# Patient Record
Sex: Male | Born: 1955 | Race: White | Hispanic: No | Marital: Married | Smoking: Current every day smoker
Health system: Southern US, Community
[De-identification: ages and names within clinical notes are randomized; demographics above are authoritative.]

## PROBLEM LIST (undated history)

## (undated) DIAGNOSIS — N200 Calculus of kidney: Secondary | ICD-10-CM

## (undated) DIAGNOSIS — M51369 Other intervertebral disc degeneration, lumbar region without mention of lumbar back pain or lower extremity pain: Secondary | ICD-10-CM

## (undated) DIAGNOSIS — E119 Type 2 diabetes mellitus without complications: Secondary | ICD-10-CM

## (undated) DIAGNOSIS — I2510 Atherosclerotic heart disease of native coronary artery without angina pectoris: Secondary | ICD-10-CM

## (undated) DIAGNOSIS — I739 Peripheral vascular disease, unspecified: Secondary | ICD-10-CM

## (undated) DIAGNOSIS — E785 Hyperlipidemia, unspecified: Secondary | ICD-10-CM

## (undated) DIAGNOSIS — C801 Malignant (primary) neoplasm, unspecified: Secondary | ICD-10-CM

## (undated) DIAGNOSIS — M5136 Other intervertebral disc degeneration, lumbar region: Secondary | ICD-10-CM

## (undated) DIAGNOSIS — M545 Low back pain, unspecified: Secondary | ICD-10-CM

## (undated) DIAGNOSIS — J449 Chronic obstructive pulmonary disease, unspecified: Secondary | ICD-10-CM

## (undated) DIAGNOSIS — K219 Gastro-esophageal reflux disease without esophagitis: Secondary | ICD-10-CM

## (undated) HISTORY — PX: BILATERAL SALPINGOOPHORECTOMY: SHX1223

## (undated) HISTORY — DX: Other intervertebral disc degeneration, lumbar region: M51.36

## (undated) HISTORY — DX: Gastro-esophageal reflux disease without esophagitis: K21.9

## (undated) HISTORY — DX: Peripheral vascular disease, unspecified: I73.9

## (undated) HISTORY — DX: Other intervertebral disc degeneration, lumbar region without mention of lumbar back pain or lower extremity pain: M51.369

## (undated) HISTORY — DX: Calculus of kidney: N20.0

## (undated) HISTORY — PX: OTHER SURGICAL HISTORY: SHX169

## (undated) HISTORY — DX: Low back pain, unspecified: M54.50

## (undated) HISTORY — DX: Chronic obstructive pulmonary disease, unspecified: J44.9

## (undated) HISTORY — DX: Hyperlipidemia, unspecified: E78.5

## (undated) HISTORY — DX: Type 2 diabetes mellitus without complications: E11.9

## (undated) HISTORY — DX: Low back pain: M54.5

## (undated) HISTORY — DX: Atherosclerotic heart disease of native coronary artery without angina pectoris: I25.10

---

## 1998-07-02 ENCOUNTER — Emergency Department (HOSPITAL_COMMUNITY): Admission: EM | Admit: 1998-07-02 | Discharge: 1998-07-02 | Payer: Self-pay | Admitting: Emergency Medicine

## 1999-07-30 ENCOUNTER — Ambulatory Visit (HOSPITAL_COMMUNITY): Admission: RE | Admit: 1999-07-30 | Discharge: 1999-07-30 | Payer: Self-pay | Admitting: *Deleted

## 1999-07-30 ENCOUNTER — Encounter: Payer: Self-pay | Admitting: *Deleted

## 2000-07-08 ENCOUNTER — Emergency Department (HOSPITAL_COMMUNITY): Admission: EM | Admit: 2000-07-08 | Discharge: 2000-07-08 | Payer: Self-pay | Admitting: Emergency Medicine

## 2007-01-20 ENCOUNTER — Observation Stay (HOSPITAL_COMMUNITY): Admission: EM | Admit: 2007-01-20 | Discharge: 2007-01-21 | Payer: Self-pay | Admitting: Emergency Medicine

## 2007-01-25 ENCOUNTER — Ambulatory Visit: Payer: Self-pay | Admitting: Cardiology

## 2007-01-27 ENCOUNTER — Encounter (HOSPITAL_COMMUNITY): Admission: RE | Admit: 2007-01-27 | Discharge: 2007-01-27 | Payer: Self-pay | Admitting: Cardiology

## 2007-02-21 ENCOUNTER — Emergency Department (HOSPITAL_COMMUNITY): Admission: EM | Admit: 2007-02-21 | Discharge: 2007-02-21 | Payer: Self-pay | Admitting: *Deleted

## 2009-01-29 ENCOUNTER — Ambulatory Visit: Payer: Self-pay | Admitting: Family Medicine

## 2009-01-29 DIAGNOSIS — J4489 Other specified chronic obstructive pulmonary disease: Secondary | ICD-10-CM | POA: Insufficient documentation

## 2009-01-29 DIAGNOSIS — J449 Chronic obstructive pulmonary disease, unspecified: Secondary | ICD-10-CM

## 2009-01-29 DIAGNOSIS — I10 Essential (primary) hypertension: Secondary | ICD-10-CM | POA: Insufficient documentation

## 2009-01-29 DIAGNOSIS — E119 Type 2 diabetes mellitus without complications: Secondary | ICD-10-CM

## 2009-01-29 DIAGNOSIS — M5137 Other intervertebral disc degeneration, lumbosacral region: Secondary | ICD-10-CM

## 2009-01-29 DIAGNOSIS — F17200 Nicotine dependence, unspecified, uncomplicated: Secondary | ICD-10-CM | POA: Insufficient documentation

## 2009-01-29 DIAGNOSIS — E785 Hyperlipidemia, unspecified: Secondary | ICD-10-CM

## 2009-01-29 LAB — CONVERTED CEMR LAB

## 2009-01-30 ENCOUNTER — Encounter: Payer: Self-pay | Admitting: Family Medicine

## 2009-01-31 ENCOUNTER — Telehealth: Payer: Self-pay | Admitting: Family Medicine

## 2009-01-31 LAB — CONVERTED CEMR LAB
ALT: 34 units/L (ref 0–53)
AST: 30 units/L (ref 0–37)
BUN: 14 mg/dL (ref 6–23)
CO2: 26 meq/L (ref 19–32)
Calcium: 9.5 mg/dL (ref 8.4–10.5)
Chloride: 100 meq/L (ref 96–112)
Cholesterol: 253 mg/dL — ABNORMAL HIGH (ref 0–200)
Creatinine, Ser: 0.95 mg/dL (ref 0.40–1.50)
HDL: 40 mg/dL (ref >39)
PSA, Free Pct: 15 — ABNORMAL LOW (ref >25)
PSA, Free: 0.2 ng/mL
Total Bilirubin: 0.5 mg/dL (ref 0.3–1.2)
Total CHOL/HDL Ratio: 6.3
VLDL: 67 mg/dL — ABNORMAL HIGH (ref 0–40)

## 2009-04-25 ENCOUNTER — Encounter: Payer: Self-pay | Admitting: Family Medicine

## 2009-04-25 DIAGNOSIS — M545 Low back pain: Secondary | ICD-10-CM

## 2009-04-25 DIAGNOSIS — K219 Gastro-esophageal reflux disease without esophagitis: Secondary | ICD-10-CM

## 2009-05-01 ENCOUNTER — Ambulatory Visit: Payer: Self-pay | Admitting: Family Medicine

## 2009-05-30 ENCOUNTER — Telehealth: Payer: Self-pay | Admitting: Family Medicine

## 2009-06-12 ENCOUNTER — Ambulatory Visit: Payer: Self-pay | Admitting: Family Medicine

## 2009-06-12 DIAGNOSIS — J189 Pneumonia, unspecified organism: Secondary | ICD-10-CM

## 2009-07-23 ENCOUNTER — Telehealth: Payer: Self-pay | Admitting: Family Medicine

## 2009-09-02 ENCOUNTER — Encounter: Payer: Self-pay | Admitting: Cardiology

## 2009-09-02 ENCOUNTER — Ambulatory Visit: Payer: Self-pay | Admitting: Family Medicine

## 2009-09-02 DIAGNOSIS — R079 Chest pain, unspecified: Secondary | ICD-10-CM | POA: Insufficient documentation

## 2009-09-02 DIAGNOSIS — R141 Gas pain: Secondary | ICD-10-CM | POA: Insufficient documentation

## 2009-09-02 DIAGNOSIS — I739 Peripheral vascular disease, unspecified: Secondary | ICD-10-CM | POA: Insufficient documentation

## 2009-09-02 DIAGNOSIS — R143 Flatulence: Secondary | ICD-10-CM

## 2009-09-02 DIAGNOSIS — R142 Eructation: Secondary | ICD-10-CM

## 2009-09-02 LAB — CONVERTED CEMR LAB: Hgb A1c MFr Bld: 7.3 %

## 2009-09-06 DIAGNOSIS — E7800 Pure hypercholesterolemia, unspecified: Secondary | ICD-10-CM

## 2009-09-10 ENCOUNTER — Encounter: Payer: Self-pay | Admitting: Family Medicine

## 2009-09-11 ENCOUNTER — Encounter: Payer: Self-pay | Admitting: Cardiology

## 2009-09-11 ENCOUNTER — Ambulatory Visit: Payer: Self-pay | Admitting: Cardiology

## 2009-09-13 ENCOUNTER — Encounter: Payer: Self-pay | Admitting: Family Medicine

## 2009-09-16 ENCOUNTER — Ambulatory Visit: Payer: Self-pay | Admitting: Family Medicine

## 2009-09-16 DIAGNOSIS — M7020 Olecranon bursitis, unspecified elbow: Secondary | ICD-10-CM | POA: Insufficient documentation

## 2009-09-17 ENCOUNTER — Encounter: Payer: Self-pay | Admitting: Family Medicine

## 2009-09-17 LAB — CONVERTED CEMR LAB
Basophils Absolute: 0 10*3/uL (ref 0.0–0.1)
Basophils Relative: 0 % (ref 0–1)
Eosinophils Absolute: 0.4 10*3/uL (ref 0.0–0.7)
Eosinophils Relative: 2 % (ref 0–5)
HCT: 43.8 % (ref 39.0–52.0)
Hemoglobin: 15 g/dL (ref 13.0–17.0)
MCHC: 34.2 g/dL (ref 30.0–36.0)
Monocytes Absolute: 1.4 10*3/uL — ABNORMAL HIGH (ref 0.1–1.0)
Platelets: 304 10*3/uL (ref 150–400)
RDW: 12.5 % (ref 11.5–15.5)

## 2009-09-25 ENCOUNTER — Encounter (HOSPITAL_COMMUNITY): Admission: RE | Admit: 2009-09-25 | Discharge: 2009-12-24 | Payer: Self-pay | Admitting: Cardiology

## 2009-09-25 ENCOUNTER — Telehealth (INDEPENDENT_AMBULATORY_CARE_PROVIDER_SITE_OTHER): Payer: Self-pay

## 2009-09-26 ENCOUNTER — Ambulatory Visit: Payer: Self-pay

## 2009-09-26 ENCOUNTER — Ambulatory Visit: Payer: Self-pay | Admitting: Cardiology

## 2009-09-26 ENCOUNTER — Encounter (HOSPITAL_COMMUNITY): Admission: RE | Admit: 2009-09-26 | Discharge: 2009-10-23 | Payer: Self-pay | Admitting: Cardiology

## 2009-09-26 ENCOUNTER — Encounter: Payer: Self-pay | Admitting: Cardiology

## 2009-09-27 ENCOUNTER — Encounter (INDEPENDENT_AMBULATORY_CARE_PROVIDER_SITE_OTHER): Payer: Self-pay | Admitting: *Deleted

## 2009-10-02 ENCOUNTER — Encounter: Payer: Self-pay | Admitting: Family Medicine

## 2009-10-03 ENCOUNTER — Encounter: Payer: Self-pay | Admitting: Family Medicine

## 2009-10-03 DIAGNOSIS — E209 Hypoparathyroidism, unspecified: Secondary | ICD-10-CM | POA: Insufficient documentation

## 2009-10-04 ENCOUNTER — Encounter (INDEPENDENT_AMBULATORY_CARE_PROVIDER_SITE_OTHER): Payer: Self-pay | Admitting: *Deleted

## 2009-10-07 ENCOUNTER — Encounter: Payer: Self-pay | Admitting: Family Medicine

## 2009-10-24 ENCOUNTER — Ambulatory Visit: Payer: Self-pay | Admitting: Gastroenterology

## 2009-10-24 ENCOUNTER — Telehealth: Payer: Self-pay | Admitting: Gastroenterology

## 2009-10-24 DIAGNOSIS — R109 Unspecified abdominal pain: Secondary | ICD-10-CM

## 2009-10-24 DIAGNOSIS — R110 Nausea: Secondary | ICD-10-CM

## 2009-10-24 DIAGNOSIS — J45909 Unspecified asthma, uncomplicated: Secondary | ICD-10-CM | POA: Insufficient documentation

## 2009-10-24 LAB — CONVERTED CEMR LAB
AST: 34 units/L (ref 0–37)
Albumin: 3.9 g/dL (ref 3.5–5.2)
Amylase: 21 units/L — ABNORMAL LOW (ref 27–131)
BUN: 11 mg/dL (ref 6–23)
Basophils Relative: 0.2 % (ref 0.0–3.0)
Creatinine, Ser: 0.9 mg/dL (ref 0.4–1.5)
Eosinophils Relative: 4.1 % (ref 0.0–5.0)
Folate: 11.6 ng/mL
GFR calc non Af Amer: 93.32 mL/min (ref >60)
Glucose, Bld: 145 mg/dL — ABNORMAL HIGH (ref 70–99)
HCT: 38.9 % — ABNORMAL LOW (ref 39.0–52.0)
Hemoglobin: 13.4 g/dL (ref 13.0–17.0)
IgA: 241 mg/dL (ref 68–378)
Lipase: 10 units/L — ABNORMAL LOW (ref 11.0–59.0)
Lymphs Abs: 2.5 10*3/uL (ref 0.7–4.0)
MCHC: 34.5 g/dL (ref 30.0–36.0)
MCV: 92.4 fL (ref 78.0–100.0)
Monocytes Absolute: 0.2 10*3/uL (ref 0.1–1.0)
Neutro Abs: 6.3 10*3/uL (ref 1.4–7.7)
Neutrophils Relative %: 66.4 % (ref 43.0–77.0)
Potassium: 3.9 meq/L (ref 3.5–5.1)
RBC: 4.21 M/uL — ABNORMAL LOW (ref 4.22–5.81)
TSH: 1.8 microintl units/mL (ref 0.35–5.50)
Tissue Transglutaminase Ab, IgA: 0.5 units (ref <7)
Vitamin B-12: 308 pg/mL (ref 211–911)
WBC: 9.4 10*3/uL (ref 4.5–10.5)

## 2009-10-29 ENCOUNTER — Ambulatory Visit: Payer: Self-pay | Admitting: Endocrinology

## 2009-10-29 ENCOUNTER — Ambulatory Visit (HOSPITAL_COMMUNITY): Admission: RE | Admit: 2009-10-29 | Discharge: 2009-10-29 | Payer: Self-pay | Admitting: Gastroenterology

## 2009-10-29 LAB — CONVERTED CEMR LAB: Magnesium: 1.6 mg/dL (ref 1.5–2.5)

## 2009-10-30 ENCOUNTER — Encounter (INDEPENDENT_AMBULATORY_CARE_PROVIDER_SITE_OTHER): Payer: Self-pay | Admitting: *Deleted

## 2009-10-30 ENCOUNTER — Encounter: Admission: RE | Admit: 2009-10-30 | Discharge: 2009-10-30 | Payer: Self-pay | Admitting: Cardiology

## 2009-10-30 ENCOUNTER — Encounter: Payer: Self-pay | Admitting: Cardiology

## 2009-10-30 ENCOUNTER — Ambulatory Visit: Payer: Self-pay | Admitting: Cardiology

## 2009-10-30 DIAGNOSIS — R9430 Abnormal result of cardiovascular function study, unspecified: Secondary | ICD-10-CM | POA: Insufficient documentation

## 2009-10-31 ENCOUNTER — Encounter: Payer: Self-pay | Admitting: Cardiology

## 2009-11-01 ENCOUNTER — Ambulatory Visit: Payer: Self-pay | Admitting: Cardiovascular Disease

## 2009-11-01 ENCOUNTER — Inpatient Hospital Stay (HOSPITAL_BASED_OUTPATIENT_CLINIC_OR_DEPARTMENT_OTHER): Admission: RE | Admit: 2009-11-01 | Discharge: 2009-11-01 | Payer: Self-pay | Admitting: Cardiovascular Disease

## 2009-11-04 ENCOUNTER — Ambulatory Visit: Payer: Self-pay | Admitting: Gastroenterology

## 2009-11-06 ENCOUNTER — Encounter: Payer: Self-pay | Admitting: Gastroenterology

## 2009-11-11 ENCOUNTER — Encounter (INDEPENDENT_AMBULATORY_CARE_PROVIDER_SITE_OTHER): Payer: Self-pay

## 2009-11-11 ENCOUNTER — Ambulatory Visit: Payer: Self-pay | Admitting: Cardiovascular Disease

## 2009-11-12 DIAGNOSIS — I2510 Atherosclerotic heart disease of native coronary artery without angina pectoris: Secondary | ICD-10-CM | POA: Insufficient documentation

## 2009-11-12 DIAGNOSIS — I70219 Atherosclerosis of native arteries of extremities with intermittent claudication, unspecified extremity: Secondary | ICD-10-CM | POA: Insufficient documentation

## 2009-11-12 LAB — CONVERTED CEMR LAB
BUN: 17 mg/dL
Basophils Absolute: 0.1 10*3/uL
Basophils Relative: 0.8 %
CO2: 26 meq/L
Calcium: 10.1 mg/dL
Chloride: 100 meq/L
Creatinine, Ser: 1 mg/dL
Eosinophils Absolute: 0.4 10*3/uL
Eosinophils Relative: 3.3 %
GFR calc non Af Amer: 82.62 mL/min
Glucose, Bld: 192 mg/dL — ABNORMAL HIGH
HCT: 42.1 %
Hemoglobin: 14 g/dL
INR: 1
Lymphocytes Relative: 26.8 %
Lymphs Abs: 3.2 10*3/uL
MCHC: 33.3 g/dL
MCV: 94.5 fL
Monocytes Absolute: 0.7 10*3/uL
Monocytes Relative: 6.1 %
Neutro Abs: 7.7 10*3/uL
Neutrophils Relative %: 63 %
Platelets: 351 10*3/uL
Potassium: 4.3 meq/L
Prothrombin Time: 10.3 s
RBC: 4.45 M/uL
RDW: 12.8 %
Sodium: 138 meq/L
WBC: 12.1 10*3/uL — ABNORMAL HIGH

## 2009-11-13 ENCOUNTER — Ambulatory Visit (HOSPITAL_COMMUNITY): Admission: RE | Admit: 2009-11-13 | Discharge: 2009-11-13 | Payer: Self-pay | Admitting: Cardiovascular Disease

## 2009-11-13 ENCOUNTER — Ambulatory Visit: Payer: Self-pay | Admitting: Cardiovascular Disease

## 2009-11-14 ENCOUNTER — Ambulatory Visit: Payer: Self-pay | Admitting: Vascular Surgery

## 2009-11-14 ENCOUNTER — Encounter: Payer: Self-pay | Admitting: Cardiovascular Disease

## 2009-11-20 ENCOUNTER — Ambulatory Visit: Payer: Self-pay | Admitting: Vascular Surgery

## 2009-11-20 ENCOUNTER — Inpatient Hospital Stay (HOSPITAL_COMMUNITY): Admission: RE | Admit: 2009-11-20 | Discharge: 2009-11-22 | Payer: Self-pay | Admitting: Vascular Surgery

## 2009-12-05 ENCOUNTER — Encounter: Payer: Self-pay | Admitting: Family Medicine

## 2009-12-05 ENCOUNTER — Ambulatory Visit: Payer: Self-pay | Admitting: Vascular Surgery

## 2009-12-12 ENCOUNTER — Ambulatory Visit: Payer: Self-pay | Admitting: Gastroenterology

## 2010-07-31 ENCOUNTER — Encounter: Payer: Self-pay | Admitting: Family Medicine

## 2010-11-25 NOTE — Procedures (Signed)
Summary: Colonoscopy  Patient: James Dixon Note: All result statuses are Final unless otherwise noted.  Tests: (1) Colonoscopy (COL)   COL Colonoscopy           DONE (C)     [facility]     [address]     [place], [place]  [address]           COLONOSCOPY PROCEDURE REPORT           PATIENT:  James Dixon, James Dixon  MR#:  [id number]     BIRTHDATE:  1956-07-27, 53 yrs. old  GENDER:  male           ENDOSCOPIST:  Vania Rea. Jarold Motto, MD, [facility]     Referred by:           PROCEDURE DATE:  11/04/2009     PROCEDURE:  Colonoscopy with snare polypectomy     ASA CLASS:  Class III     INDICATIONS:  colorectal cancer screening, average risk, Routine     Risk Screening           MEDICATIONS:   Fentanyl 100 mcg IV, Versed 10 mg IV           DESCRIPTION OF PROCEDURE:   After the risks benefits and     alternatives of the procedure were thoroughly explained, informed     consent was obtained.  Digital rectal exam was performed and     revealed no abnormalities.   The LB [id number] endoscope     was introduced through the anus and advanced to the cecum, which     was identified by both the appendix and ileocecal valve, without     limitations.  The quality of the prep was good, using MoviPrep.     The instrument was then slowly withdrawn as the colon was fully     examined.     <<PROCEDUREIMAGES>>           FINDINGS:  A sessile polyp was found at the hepatic flexure. 1.7     cm. flat polyp snare excised  Severe diverticulosis was found     sigmoid to descending  This was otherwise a normal examination of     the colon.   Retroflexed views in the rectum revealed no     abnormalities.    The scope was then withdrawn from the patient     and the procedure completed.           COMPLICATIONS:  None           ENDOSCOPIC IMPRESSION:     1) Sessile polyp at the hepatic flexure     2) Severe diverticulosis in the sigmoid to descending     3) Otherwise normal examination  RECOMMENDATIONS:     1) Follow up colonoscopy in 5 years     2) high fiber diet           REPEAT EXAM:  No           ______________________________     Vania Rea. Jarold Motto, MD, [facility]           CC:  Seymour Bars, DO           n.     REVISED:  11/15/2009 03:58 PM     eSIGNED:   Vania Rea. Patterson at 11/15/2009 03:58 PM           Leward Quan, [id number]  Note: An exclamation mark (!) indicates a result  that was not dispersed into the flowsheet. Document Creation Date: 11/15/2009 3:58 PM _______________________________________________________________________  (1) Order result status: Final Collection or observation date-time: 11/04/2009 11:38 Requested date-time:  Receipt date-time:  Reported date-time:  Referring Physician:   Ordering Physician: Sheryn Bison [phone]) Specimen Source:  Source: Launa Grill Order Number: [id number] Lab site:

## 2010-11-25 NOTE — Letter (Signed)
Summary: Cardiac Catheterization Instructions- [facility]  [facility] [address]   [place], [place] [address]   Phone: [phone]  Fax:      10/30/2009 MRN: [id number]  James Dixon [address] [place], [place]  [address]  Dear Mr. Neale Burly,   You are scheduled for a Cardiac Catheterization on FRIDAY 11-01-09 with Dr.COOPER                                                               Please arrive to the 1st floor of the Heart and Vascular Center at [facility] at  10:30 am  on the day of your procedure. Please do not arrive before 6:30 a.m. Call the Heart and Vascular Center at [phone] if you are unable to make your appointmnet. The Code to get into the parking garage under the building is 0090. Take the elevators to the 1st floor. You must have someone to drive you home. Someone must be with you for the first 24 hours after you arrive home. Please wear clothes that are easy to get on and off and wear slip-on shoes. Do not eat or drink after midnight except water with your medications that morning. Bring all your medications and current insurance cards with you.  ___ DO NOT take these medications before your procedure: ________________________________________________________________  ___ Make sure you take your aspirin.  ___ You may take ALL of your medications with water that morning. ________________________________________________________________________________________________________________________________  ___ DO NOT take ANY medications before your procedure.  ___ Pre-med instructions:  ________________________________________________________________________________________________________________________________  The usual length of stay after your procedure is 2 to 3 hours. This can vary.  If you have any questions, please call the office at the number listed above.   Deliah Goody, RN

## 2010-11-25 NOTE — Letter (Signed)
Summary: Patient Notice- Polyp Results  [facility]  [address] [place], [place] [address]   Phone: [phone]  Fax: [phone]        November 06, 2009 MRN: [id number]    DONACIANO RANGE [address] [place], [place]  [address]    Dear Mr. Antony,  I am pleased to inform you that the colon polyp(s) removed during your recent colonoscopy was (were) found to be benign (no cancer detected) upon pathologic examination.  I recommend you have a repeat colonoscopy examination in 3_ years to look for recurrent polyps, as having colon polyps increases your risk for having recurrent polyps or even colon cancer in the future.  Should you develop new or worsening symptoms of abdominal pain, bowel habit changes or bleeding from the rectum or bowels, please schedule an evaluation with either your primary care physician or with me.  Additional information/recommendations:  __ No further action with gastroenterology is needed at this time. Please      follow-up with your primary care physician for your other healthcare      needs.  __ Please call [phone] to schedule a return visit to review your      situation.  __ Please keep your follow-up visit as already scheduled.  xx__ Continue treatment plan as outlined the day of your exam.  Please call us if you are having persistent problems or have questions about your condition that have not been fully answered at this time.  Sincerely,  Mardella Layman MD [facility]  This letter has been electronically signed by your physician.  Appended Document: Patient Notice- Polyp Results Letter mailed 1.14.11

## 2010-11-25 NOTE — Assessment & Plan Note (Signed)
Summary: NPV   Visit Type:  Initial Consult Referring Provider:  Dr Jens Som Primary Provider:  Seymour Bars DO  CC:  Ne PV evaluatin. Follow up Dopplers.  History of Present Illness: This is a 55 year-old male presenting for initial evaluation of lower extremity PAD. He has a long history of bilateral leg pain with ambulation over the past 5 years. He reports progressive symptoms, now occurring at short distance (less than one block). His right calf is most affected and he describes the pain as a 'cramp.' Also complains that the right foot feels like it 'falls asleep' with ambulation. He also has left calf pain with walking but this is mild. He denies rest pain and has no history of ulceration or rash.  ABI's and arterial duplex exam were performed and showed an ABI of 0.77 on the right and 0.92 on the left. The right SFA is occluded proximally (2-3 cm into the vessel) and reconstitutes in the mid-SFA.  The left SFA has severe stenosis with post-stenotic dilatation but is patent throughout it's course by ultrasound.  Current Medications (verified): 1)  Metformin Hcl 1000 Mg Tabs (Metformin Hcl) .... Take 1 Tablet By Mouth Two Times A Day 2)  Simvastatin 40 Mg Tabs (Simvastatin) .Marland Kitchen.. 1 Tab By Mouth Qhs 3)  Lisinopril-Hydrochlorothiazide 20-25 Mg Tabs (Lisinopril-Hydrochlorothiazide) .Marland Kitchen.. 1 Tab By Mouth Daily 4)  Ventolin Hfa 108 (90 Base) Mcg/act Aers (Albuterol Sulfate) .... Take 2 Puffs Four Times A Day 5)  Hydrocodone-Acetaminophen 10-650 Mg Tabs (Hydrocodone-Acetaminophen) .Marland Kitchen.. 1 Tab By Mouth Q 6 Hrs As Needed Severe Pain 6)  Metoprolol Tartrate 50 Mg Tabs (Metoprolol Tartrate) .Marland Kitchen.. 1 Tab By Mouth Bid 7)  Freestyle Lite Test Strips .... Use Daily As Directed 8)  Omeprazole 40 Mg Cpdr (Omeprazole) .Marland Kitchen.. 1 Tab By Mouth Daily, 30 Min Before Breakfast. 9)  Aspirin 81 Mg Tbec (Aspirin) .... Take One Tablet By Mouth Daily 10)  Supplemental Humidified Oxygen Via Nasal Canula .... 2 L/ Minute Dx:  Copd Fax To [facility]. 11)  Ultrase Mt 20 65-20-65 Mu  Cpep (Amylase-Lipase-Protease) .... Take 1tab Three Times A Day With Meals 12)  Pravastatin Sodium 40 Mg Tabs (Pravastatin Sodium) .... Take One Tablet By Mouth Daily At Bedtime 13)  Hydrocodone-Acetaminophen 10-650 Mg Tabs (Hydrocodone-Acetaminophen) .... As Needed  Allergies: 1)  ! Codeine 2)  ! Amitriptyline Hcl (Amitriptyline Hcl) 3)  ! * Codeine High Doses  Past History:  Past medical, surgical, family and social histories (including risk factors) reviewed, and no changes noted (except as noted below).  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 R RTC repair  Family History: Reviewed history from 10/29/2009 and no changes required. mother died of 'brain cancer' in her 45s. father died of cancer, unkown type in his 45s sister died ? lung cancer sisters with HTN, high chol, DM No premature CAD No FH of Colon Cancer: neg for parathyroid or ca++ problems  Social History: Reviewed history from 10/24/2009 and no changes required. Disabled due to hip/ back pain. 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  Daughter, grown, lives wth him. 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 Daily Caffeine Use  Review of Systems       Positive for low back pain, otherwise negative except as per HPI   Vital Signs:  Patient profile:   55 year old male Height:      66 inches Weight:      200 pounds BMI:     32.40 Pulse rate:   92 / minute Pulse rhythm:    regular Resp:     18 per minute BP sitting:   96 / 62  (left arm) Cuff size:   large  Vitals Entered By: Vikki Ports (November 11, 2009 10:52 AM)  Serial Vital Signs/Assessments:  Time      Position  BP       Pulse  Resp  Temp     By           R Arm     94/60                          Vikki Ports   Physical Exam  General:  Pt is alert and oriented, obese male, in no acute distress. HEENT: normal Neck: normal carotid upstrokes without bruits, JVP normal Lungs: CTA CV: RRR without murmur or gallop Abd: soft, NT, positive BS, no bruit, no organomegaly Ext: no clubbing, cyanosis, or edema. femoral pulses are 2+, pedals are not palpable on the right and are trace on the left. Skin: warm and dry without rash    Impression & Recommendations:  Problem # 1:  ATHEROSCLEROSIS W /INT CLAUDICATION (ICD-440.21) Long discussion with the patient and his wife. I carefully reviewed the findings of the ultrasound/ABI's with the patient. Treatment options were reviewed: medical therapy and trial of cilostazol vs angiography with either endovascular or surgical repair. Risks and benefits of angio/PTA were discussed.  I stressed the importance of tobacco cessation and discussed this at length with the patient (who has been smoking since age 24). He and his wife strongly prefer revascularization because of the severity of symptoms. Will schedule for lower extremity angiography with an eye toward PTA if a short occlusion is present versus surgical bypass for more diffuse disease.  Problem # 2:  CAD, NATIVE VESSEL (ICD-414.01) Recent cath results reviewed. Pt stable on medical therapy. Followed by Dr Jens Som.  His updated medication list for this problem includes:    Lisinopril-hydrochlorothiazide 20-25 Mg Tabs (Lisinopril-hydrochlorothiazide) .Marland Kitchen... 1 tab by mouth daily    Metoprolol Tartrate 50 Mg Tabs (Metoprolol tartrate) .Marland Kitchen... 1 tab by mouth bid    Aspirin 81 Mg Tbec (Aspirin) .Marland Kitchen... Take one  tablet by mouth daily  Other Orders: PV Procedure (PV Procedure) TLB-BMP (Basic Metabolic Panel-BMET) (80048-METABOL) TLB-CBC Platelet - w/Differential (85025-CBCD) TLB-PT (Protime) (85610-PTP)  Patient Instructions: 1)  Your physician has requested that you have a peripheral vascular angiogram. This exam is performed at the hospital. During this exam IV contrast is used to look at arterial blood flow.  Please review the information sheet given for details. 2)  Your physician recommends that you continue on your current medications as directed. Please refer to the Current Medication list given to you today.

## 2010-11-25 NOTE — Assessment & Plan Note (Signed)
Summary: [facility]   Visit Type:  Follow-up Referring Provider:  Seymour Bars DO Primary Provider:  Seymour Bars DO  CC:  Discuss myoview results.  History of Present Illness: Pleasant gentleman I recently evaluated for atypical chest pain. He also was told that he had a blockage in his lower extremity by a physician in [place]. ABIs were scheduled and revealed occlusion of the SFA on the right with reconstitution distally by collaterals. There was a focal mid SFA greater than 50%. A. Myoview was performed on December 2 showed an ejection fraction of 57% and a small reversible defect at the apex consistent with ischemia. Since then he has dyspnea with more extreme activities but not with routine activities. It is relieved with rest. It is not associated with chest pain. There is no orthopnea, PND or pedal edema. He does occasionally have a pain in his chest. It is in the left chest area and brief lasting only seconds. It is described as a sharp pain. It is nonexertional. He continues to have bilateral lower extremity pain from the hips to the calves. This occurs with walking and is relieved with rest. It is worse on the right.  Current Medications (verified): 1)  Metformin Hcl 1000 Mg Tabs (Metformin Hcl) .... Take 1 Tablet By Mouth Two Times A Day 2)  Simvastatin 40 Mg Tabs (Simvastatin) .Marland Kitchen.. 1 Tab By Mouth Qhs 3)  Lisinopril-Hydrochlorothiazide 20-25 Mg Tabs (Lisinopril-Hydrochlorothiazide) .Marland Kitchen.. 1 Tab By Mouth Daily 4)  Ventolin Hfa 108 (90 Base) Mcg/act Aers (Albuterol Sulfate) .... Take 2 Puffs Four Times A Day 5)  Hydrocodone-Acetaminophen 10-650 Mg Tabs (Hydrocodone-Acetaminophen) .Marland Kitchen.. 1 Tab By Mouth Q 6 Hrs As Needed Severe Pain 6)  Metoprolol Tartrate 50 Mg Tabs (Metoprolol Tartrate) .Marland Kitchen.. 1 Tab By Mouth Bid 7)  Freestyle Lite Test Strips .... Use Daily As Directed 8)  Omeprazole 40 Mg Cpdr (Omeprazole) .Marland Kitchen.. 1 Tab By Mouth Daily, 30 Min Before Breakfast. 9)  Duoneb 0.5-2.5 (3)  Mg/70ml Soln (Ipratropium-Albuterol) .... 3 Ml Neb Qid Prn 10)  Aspirin 81 Mg Tbec (Aspirin) .... Take One Tablet By Mouth Daily 11)  Supplemental Humidified Oxygen Via Nasal Canula .... 2 L/ Minute Dx: Copd Fax To [facility]. 12)  Ultrase Mt 20 65-20-65 Mu  Cpep (Amylase-Lipase-Protease) .... Take 1tab Three Times A Day With Meals  Allergies: 1)  ! Codeine 2)  ! Amitriptyline Hcl (Amitriptyline Hcl)  Past History:  Past Medical History: Current Problems:  HYPERLIPIDEMIA (ICD-272.4) ESSENTIAL HYPERTENSION, BENIGN (ICD-401.1) PVD (ICD-443.9) LUMBAGO (ICD-724.2) GERD (ICD-530.81) COPD (ICD-496) DEGENERATIVE DISC DISEASE, LUMBAR SPINE (ICD-722.52) DIABETES MELLITUS, TYPE II, WITHOUT COMPLICATIONS (ICD-250.00) NEPHROLITHIASIS  Past Surgical History: Reviewed history from 09/11/2009 and no changes required. R RTC repair  Social History: Reviewed history from 10/24/2009 and no changes required. Disabled due to hip/ back pain. 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  Daughter, grown, lives wth him. 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 Daily Caffeine Use  Review of Systems       Significant for claudication but no fevers or chills, productive cough, hemoptysis, dysphasia, odynophagia, melena, hematochezia, dysuria, hematuria, rash, seizure activity, orthopnea, PND, pedal edema. Remaining systems are negative.   Vital Signs:  Patient profile:   55 year old male Height:      66 inches Weight:      199.75 pounds BMI:     32.36 Pulse rate:   80 / minute Pulse rhythm:   regular Resp:  18 per minute BP sitting:   110 / 60  (right arm) Cuff size:   large  Vitals Entered By: Vikki Ports (October 30, 2009 3:24 PM)  Physical Exam  General:  Well-developed well-nourished in no acute distress.  Skin is warm and dry.  HEENT is normal.  Neck is supple. No thyromegaly.  Chest is clear to auscultation with normal expansion.  Cardiovascular  exam is regular rate and rhythm.  Abdominal exam nontender or distended. No masses palpated. Extremities show no edema. neuro grossly intact    Impression & Recommendations:  Problem # 1:  CARDIOVASCULAR FUNCTION STUDY, ABNORMAL (ICD-794.30)  Patient's symptoms are atypical. I am not convinced that her cardiac. However I have reviewed his Myoview and there is apical ischemia. He also has multiple cardiovascular risk factors. I think cardiac catheterization is warranted. The risk and benefits have been discussed and he agrees to proceed. We will arrange this as an outpatient. Continue aspirin, beta blocker and statin.  Orders: T-Basic Metabolic Panel [id number]) T-CBC No Diff ([id number]) T-Protime, Auto ([id number]) T-2 View CXR (71020TC)  Problem # 2:  CLAUDICATION (ICD-443.9) ABIs consistent with peripheral vascular disease and he does have claudication. Continue aspirin and statin. Refer for possible angiography.  Problem # 3:  HYPERLIPIDEMIA (ICD-272.4) Continue statin. Lipids and liver monitored by his primary care. His updated medication list for this problem includes:    Simvastatin 40 Mg Tabs (Simvastatin) .Marland Kitchen... 1 tab by mouth qhs  Problem # 4:  ESSENTIAL HYPERTENSION, BENIGN (ICD-401.1)  Blood pressure controlled on present medications. Will continue. His updated medication list for this problem includes:    Lisinopril-hydrochlorothiazide 20-25 Mg Tabs (Lisinopril-hydrochlorothiazide) .Marland Kitchen... 1 tab by mouth daily    Metoprolol Tartrate 50 Mg Tabs (Metoprolol tartrate) .Marland Kitchen... 1 tab by mouth bid    Aspirin 81 Mg Tbec (Aspirin) .Marland Kitchen... Take one tablet by mouth daily  His updated medication list for this problem includes:    Lisinopril-hydrochlorothiazide 20-25 Mg Tabs (Lisinopril-hydrochlorothiazide) .Marland Kitchen... 1 tab by mouth daily    Metoprolol Tartrate 50 Mg Tabs (Metoprolol tartrate) .Marland Kitchen... 1 tab by mouth bid    Aspirin 81 Mg Tbec (Aspirin) .Marland Kitchen... Take one tablet by mouth  daily  Problem # 5:  GERD (ICD-530.81)  His updated medication list for this problem includes:    Omeprazole 40 Mg Cpdr (Omeprazole) .Marland Kitchen... 1 tab by mouth daily, 30 min before breakfast.  His updated medication list for this problem includes:    Omeprazole 40 Mg Cpdr (Omeprazole) .Marland Kitchen... 1 tab by mouth daily, 30 min before breakfast.  Problem # 6:  CIGARETTE SMOKER (ICD-305.1) Pt counseled on discontinuing for between 3-10 minutes.  Problem # 7:  COPD (ICD-496)  His updated medication list for this problem includes:    Ventolin Hfa 108 (90 Base) Mcg/act Aers (Albuterol sulfate) .Marland Kitchen... Take 2 puffs four times a day    Duoneb 0.5-2.5 (3) Mg/72ml Soln (Ipratropium-albuterol) .Marland KitchenMarland KitchenMarland KitchenMarland Kitchen 3 ml neb qid prn  His updated medication list for this problem includes:    Ventolin Hfa 108 (90 Base) Mcg/act Aers (Albuterol sulfate) .Marland Kitchen... Take 2 puffs four times a day    Duoneb 0.5-2.5 (3) Mg/59ml Soln (Ipratropium-albuterol) .Marland KitchenMarland KitchenMarland KitchenMarland Kitchen 3 ml neb qid prn  Problem # 8:  DIABETES MELLITUS, TYPE II, WITHOUT COMPLICATIONS (ICD-250.00)  His updated medication list for this problem includes:    Metformin Hcl 1000 Mg Tabs (Metformin hcl) .Marland Kitchen... Take 1 tablet by mouth two times a day    Lisinopril-hydrochlorothiazide 20-25 Mg Tabs (Lisinopril-hydrochlorothiazide) .Marland KitchenMarland KitchenMarland KitchenMarland Kitchen 1  tab by mouth daily    Aspirin 81 Mg Tbec (Aspirin) .Marland Kitchen... Take one tablet by mouth daily  His updated medication list for this problem includes:    Metformin Hcl 1000 Mg Tabs (Metformin hcl) .Marland Kitchen... Take 1 tablet by mouth two times a day    Lisinopril-hydrochlorothiazide 20-25 Mg Tabs (Lisinopril-hydrochlorothiazide) .Marland Kitchen... 1 tab by mouth daily    Aspirin 81 Mg Tbec (Aspirin) .Marland Kitchen... Take one tablet by mouth daily  Patient Instructions: 1)  Your physician recommends that you schedule a follow-up appointment in: 3 MONTHS 2)  You have been referred to DR [facility] 11-11-09 @ 2:15PM IN THE [place] OFFICE 3)  Your physician has requested that you have a cardiac  catheterization.  Cardiac catheterization is used to diagnose and/or treat various heart conditions. Doctors may recommend this procedure for a number of different reasons. The most common reason is to evaluate chest pain. Chest pain can be a symptom of coronary artery disease (CAD), and cardiac catheterization can show whether plaque is narrowing or blocking your heart's arteries. This procedure is also used to evaluate the valves, as well as measure the blood flow and oxygen levels in different parts of your heart.  For further information please visit [email or web address]  Please follow instruction sheet, as given.

## 2010-11-25 NOTE — Letter (Signed)
Summary: Unable to Contact Patient/[facility]  Unable to Contact Patient/[facility]   Imported By: Lanelle Bal 08/15/2010 16:11:24  _____________________________________________________________________  External Attachment:    Type:   Image     Comment:   External Document

## 2010-11-25 NOTE — Assessment & Plan Note (Signed)
Summary: NEW ENDO/SECURE HORIZONS/HYPOPARATHYROIDISM/BOWEN/CD   Vital Signs:  Patient profile:   55 year old male Height:      66 inches (167.64 cm) Weight:      199.50 pounds (90.68 kg) O2 Sat:      94 % on Room air Temp:     97.3 degrees F (36.28 degrees C) oral Pulse rate:   85 / minute BP sitting:   102 / 60  (left arm) Cuff size:   large  Vitals Entered By: Josph Macho CMA (October 29, 2009 1:13 PM)  O2 Flow:  Room air CC: New Endo:Hypoparathyroidism/ CF Is Patient Diabetic? Yes   Referring Provider:  Seymour Bars DO Primary Provider:  Seymour Bars DO  CC:  New Endo:Hypoparathyroidism/ CF.  History of Present Illness: pt was recently noted to have a low parathyroid hormone level.  he is unaware of having had any ca++ or parathyroid problems in the past.   symptomatically, pt states pt states few years of severe low-back pain, and associated fatigue.    Current Medications (verified): 1)  Metformin Hcl 1000 Mg Tabs (Metformin Hcl) .... Take 1 Tablet By Mouth Two Times A Day 2)  Simvastatin 40 Mg Tabs (Simvastatin) .Marland Kitchen.. 1 Tab By Mouth Qhs 3)  Lisinopril-Hydrochlorothiazide 20-25 Mg Tabs (Lisinopril-Hydrochlorothiazide) .Marland Kitchen.. 1 Tab By Mouth Daily 4)  Ventolin Hfa 108 (90 Base) Mcg/act Aers (Albuterol Sulfate) .... Take 2 Puffs Four Times A Day 5)  Hydrocodone-Acetaminophen 10-650 Mg Tabs (Hydrocodone-Acetaminophen) .Marland Kitchen.. 1 Tab By Mouth Q 6 Hrs As Needed Severe Pain 6)  Metoprolol Tartrate 50 Mg Tabs (Metoprolol Tartrate) .Marland Kitchen.. 1 Tab By Mouth Bid 7)  Freestyle Lite Test Strips .... Use Daily As Directed 8)  Omeprazole 40 Mg Cpdr (Omeprazole) .Marland Kitchen.. 1 Tab By Mouth Daily, 30 Min Before Breakfast. 9)  Duoneb 0.5-2.5 (3) Mg/17ml Soln (Ipratropium-Albuterol) .... 3 Ml Neb Qid Prn 10)  Aspirin 81 Mg Tbec (Aspirin) .... Take One Tablet By Mouth Daily 11)  Naprosyn 500 Mg Tabs (Naproxen) .Marland Kitchen.. 1 Tab By Mouth Two Times A Day With Food 12)  Supplemental Humidified Oxygen Via Nasal Canula  .... 2 L/ Minute Dx: Copd Fax To [facility]. 13)  Moviprep 100 Gm  Solr (Peg-Kcl-Nacl-Nasulf-Na Asc-C) .... As Per Prep Instructions. 14)  Equalactin 625 Mg Chew (Calcium Polycarbophil) .... One Tid  Allergies (verified): 1)  ! Codeine 2)  ! Amitriptyline Hcl (Amitriptyline Hcl)  Past History:  Past Medical History: Last updated: 09/16/2009 Current Problems:  HYPERLIPIDEMIA (ICD-272.4) ESSENTIAL HYPERTENSION, BENIGN (ICD-401.1) PVD (ICD-443.9) LUMBAGO (ICD-724.2) GERD (ICD-530.81) MORBID OBESITY (ICD-278.01) COPD (ICD-496) DEGENERATIVE DISC DISEASE, LUMBAR SPINE (ICD-722.52) DIABETES MELLITUS, TYPE II, WITHOUT COMPLICATIONS (ICD-250.00) NEPHROLITHIASIS  Family History: Reviewed history from 10/24/2009 and no changes required. mother died of 'brain cancer' in her 32s. father died of cancer, unkown type in his 10s sister died ? lung cancer sisters with HTN, high chol, DM No premature CAD No FH of Colon Cancer: neg for parathyroid or ca++ problems  Social History: Reviewed history from 10/24/2009 and no changes required. Disabled due to hip/ back pain. 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  Daughter, grown, lives wth him. 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 Daily Caffeine Use  Review of Systems  The patient denies fever.         denies n/v, syncope, erectile dysfunction, rash, diarrhea, sob, urinary frequency, blury vision, .  he reports cramps in the calf areas of the legs at night,  or with exertion.  he has intermittent palpitations.  he ahs lost a few lbs, but says this is not a trend.  he has slight numbness of the left thigh, dizziness,  and blury vision.    Physical Exam  General:  normal appearance.   Head:  head: no deformity eyes: no periorbital swelling, no proptosis external nose and ears are normal mouth: no lesion seen Neck:  Supple without thyroid enlargement or tenderness. No cervical lymphadenopathy Lungs:   few exp wheezes Heart:  Regular rate and rhythm without murmurs or gallops noted. Normal S1,S2.   Abdomen:  abdomen is soft, nontender.  no hepatosplenomegaly.   not distended.  no hernia  Msk:  muscle bulk and strength are grossly normal.  no obvious joint swelling.  gait is normal and steady  Extremities:  no deformity.  no ulcer on the feet.  feet are of normal color and temp.  no edema  Neurologic:  cn 2-12 grossly intact.   readily moves all 4's.   sensation is intact to touch on all 4's Skin:  normal texture and temp.  no rash.  not diaphoretic  Cervical Nodes:  No significant adenopathy.  Psych:  Alert and cooperative; normal mood and affect; normal attention span and concentration.   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  uncertain etiology  Problem # 2:  leg cramps not related to #1  Problem # 3:  DEGENERATIVE DISC DISEASE, LUMBAR SPINE (ICD-722.52) this is the cause of his low-back pain, rather than #1  Other Orders: TLB-Magnesium (Mg) (83735-MG) TLB-Phosphorus (84100-PHOS) Consultation Level IV (96045)  Patient Instructions: 1)  you should have a chest-x-ray done, if you have not had one recently. 2)  check magnesium and phosphorus blood tests today. 3)  if these tests are normal, please have blood tests every 6 months or so.  please return here if your calcium goes high.

## 2010-11-25 NOTE — Procedures (Signed)
Summary: Colonoscopy  Patient: James Dixon Note: All result statuses are Final unless otherwise noted.  Tests: (1) Colonoscopy (COL)   COL Colonoscopy           DONE     [facility]     [address]     [place], [place]  [address]           COLONOSCOPY PROCEDURE REPORT           PATIENT:  James Dixon, James Dixon  MR#:  [id number]     BIRTHDATE:  1955/12/21, 53 yrs. old  GENDER:  male           ENDOSCOPIST:  Vania Rea. Jarold Motto, MD, [facility]     Referred by:           PROCEDURE DATE:  11/04/2009     PROCEDURE:  Colonoscopy with snare polypectomy     ASA CLASS:  Class III     INDICATIONS:  colorectal cancer screening, average risk, abdominal     pain, Routine Risk Screening           MEDICATIONS:   Fentanyl 100 mcg IV, Versed 10 mg IV           DESCRIPTION OF PROCEDURE:   After the risks benefits and     alternatives of the procedure were thoroughly explained, informed     consent was obtained.  Digital rectal exam was performed and     revealed no abnormalities.   The LB CF-H180AL [id number] endoscope     was introduced through the anus and advanced to the cecum, which     was identified by both the appendix and ileocecal valve, without     limitations.  The quality of the prep was good, using MoviPrep.     The instrument was then slowly withdrawn as the colon was fully     examined.     <<PROCEDUREIMAGES>>           FINDINGS:  A sessile polyp was found at the hepatic flexure. 1.7     cm. flat polyp snare excised  Severe diverticulosis was found     sigmoid to descending  This was otherwise a normal examination of     the colon.   Retroflexed views in the rectum revealed no     abnormalities.    The scope was then withdrawn from the patient     and the procedure completed.           COMPLICATIONS:  None           ENDOSCOPIC IMPRESSION:     1) Sessile polyp at the hepatic flexure     2) Severe diverticulosis in the sigmoid to descending     3) Otherwise normal examination   RECOMMENDATIONS:     1) Follow up colonoscopy in 5 years     2) high fiber diet           REPEAT EXAM:  No           ______________________________     Vania Rea. Jarold Motto, MD, [facility]           CC:  Seymour Bars, DO           n.     eSIGNED:   Vania Rea. Patterson at 11/04/2009 11:45 AM           Leward Quan, [id number]  Note: An exclamation mark (!) indicates a result that was not dispersed into the flowsheet.  Document Creation Date: 11/04/2009 11:45 AM _______________________________________________________________________  (1) Order result status: Final Collection or observation date-time: 11/04/2009 11:38 Requested date-time:  Receipt date-time:  Reported date-time:  Referring Physician:   Ordering Physician: Sheryn Bison [phone]) Specimen Source:  Source: Launa Grill Order Number: [id number] Lab site:   Appended Document: Colonoscopy     Procedures Next Due Date:    Colonoscopy: 10/2014  Appended Document: Colonoscopy     Procedures Next Due Date:    Colonoscopy: 10/2012

## 2010-11-25 NOTE — Assessment & Plan Note (Signed)
Summary: discuss findings of egd/colon/tcw   History of Present Illness Visit Type: Follow-up Visit Primary GI MD: Sheryn Bison MD FACP FAGA Primary Provider: Seymour Bars DO Requesting Provider: Seymour Bars DO Chief Complaint: Constipation, bloating History of Present Illness:   Negative workup included an ultrasound, endoscopy and colonoscopy. He did have a colon polyp removed which was benign. He continues complaining of distention, gas, bloating, and vague abdominal discomfort. It is of note that the patient is on chronic narcotic therapy. 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   GI Review of Systems    Reports bloating and  nausea.      Denies abdominal pain, acid reflux, belching, chest pain, dysphagia with liquids, dysphagia with solids, heartburn, loss of appetite, vomiting, vomiting blood, weight loss, and  weight gain.      Reports constipation.     Denies anal fissure, black tarry stools, change in bowel habit, diarrhea, diverticulosis, fecal incontinence, heme positive stool, hemorrhoids, irritable bowel syndrome, jaundice, light color stool, liver problems, rectal bleeding, and  rectal pain.    Current Medications (verified): 1)  Metformin Hcl 1000 Mg Tabs (Metformin Hcl) .... Take 1 Tablet By Mouth Two Times A Day 2)  Simvastatin 40 Mg Tabs (Simvastatin) .Marland Kitchen.. 1 Tab By Mouth Qhs 3)  Lisinopril-Hydrochlorothiazide 20-25 Mg Tabs (Lisinopril-Hydrochlorothiazide) .Marland Kitchen.. 1 Tab By Mouth Daily 4)  Ventolin Hfa 108 (90 Base) Mcg/act Aers (Albuterol Sulfate) .... Take 2 Puffs Four Times A Day 5)  Hydrocodone-Acetaminophen 10-650 Mg Tabs (Hydrocodone-Acetaminophen) .Marland Kitchen.. 1 Tab By Mouth Q 6 Hrs As Needed Severe Pain 6)  Metoprolol Tartrate 50 Mg Tabs (Metoprolol Tartrate) .Marland Kitchen.. 1 Tab By Mouth Bid 7)  Freestyle Lite Test Strips .... Use Daily As Directed 8)  Omeprazole 40 Mg Cpdr (Omeprazole) .Marland Kitchen.. 1 Tab By Mouth Daily, 30 Min Before  Breakfast. 9)  Aspirin 81 Mg Tbec (Aspirin) .... Take One Tablet By Mouth Daily 10)  Supplemental Humidified Oxygen Via Nasal Canula .... 2 L/ Minute Dx: Copd Fax To [facility]. 11)  Ultrase Mt 20 65-20-65 Mu  Cpep (Amylase-Lipase-Protease) .... Take 1tab Three Times A Day With Meals 12)  Pravastatin Sodium 40 Mg Tabs (Pravastatin Sodium) .... Take One Tablet By Mouth Daily At Bedtime  Allergies (verified): 1)  ! Codeine 2)  ! Amitriptyline Hcl (Amitriptyline Hcl) 3)  ! * Codeine High Doses  Past History:  Past medical, surgical, family and social histories (including risk factors) reviewed for relevance to current acute and chronic problems.  Past Medical History: Reviewed history from 11/11/2009 and no changes required. Current Problems:  CAD with total RCA, and moderate LAD/LCx stenosis, medical Rx HYPERLIPIDEMIA (ICD-272.4) ESSENTIAL HYPERTENSION, BENIGN (ICD-401.1) PVD (ICD-443.9) with intermittent claudication LUMBAGO (ICD-724.2) GERD (ICD-530.81) COPD (ICD-496) DEGENERATIVE DISC DISEASE, LUMBAR SPINE (ICD-722.52) DIABETES MELLITUS, TYPE II, WITHOUT COMPLICATIONS (ICD-250.00) NEPHROLITHIASIS  Past Surgical History: R RTC repair BSO  Family History: Reviewed history from 10/29/2009 and no changes required. mother died of 'brain cancer' in her 15s. father died of cancer, unkown type in his 9s sister died ? lung cancer sisters with HTN, high chol, DM No premature CAD No FH of Colon Cancer: neg for parathyroid or ca++ problems  Social History: Reviewed history from 10/24/2009 and no changes required. Disabled due to hip/ back pain. 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  Daughter, grown, lives wth him. 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 Daily Caffeine Use  Review of Systems  The patient complains of allergy/sinus, anxiety-new, arthritis/joint pain, back pain, change in vision, fatigue, fever, heart rhythm changes,  muscle pains/cramps, night sweats, shortness of breath, sleeping problems, swelling of feet/legs, and vision changes.  The patient denies anemia, blood in urine, breast changes/lumps, confusion, cough, coughing up blood, depression-new, fainting, headaches-new, hearing problems, heart murmur, itching, menstrual pain, nosebleeds, pregnancy symptoms, skin rash, sore throat, swollen lymph glands, thirst - excessive , urination - excessive , urination changes/pain, urine leakage, and voice change.    Vital Signs:  Patient profile:   55 year old male Height:      66 inches Weight:      199.50 pounds BMI:     32.32 Pulse rate:   60 / minute Pulse rhythm:   regular BP sitting:   104 / 66  (left arm) Cuff size:   regular  Vitals Entered By: June McMurray CMA Duncan Dull) (December 12, 2009 10:52 AM)  Physical Exam  General:  Well developed, well nourished, no acute distress.healthy appearing.   Head:  Normocephalic and atraumatic. Eyes:  PERRLA, no icterus.exam deferred to patient's ophthalmologist.   Abdomen:  Soft, nontender and nondistended. No masses, hepatosplenomegaly or hernias noted. Normal bowel sounds. Psych:  Alert and cooperative. Normal mood and affect.   Impression & Recommendations:  Problem # 1:  ABDOMINAL BLOATING (ICD-787.3) Assessment Unchanged Trial of pancreatic extract supplementation has caused no improvement. 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 Will try Xifaxan 550 mg twice a day for 10 days along with probiotic therapy. Other considerations would be trial of Amitiza . I am doubtful we will solve this patient's bowel problems which seem related to chronic regular narcotic use.Ultrasound showed fatty infiltration of the liver but liver function tests are normal. I have stopped Ultrase trial. He will call in 2 weeks time for progress report.  Problem # 2:  SCREENING COLORECTAL-CANCER (ICD-V76.51) Assessment: Unchanged F/u as  per clinical protocol.  Problem # 3:  ABDOMINAL DISTENSION (ICD-787.3) Assessment: Unchanged Negative Gi workup...Marland Kitchenno masses or ascites.  Problem # 4:  DEGENERATIVE DISC DISEASE, LUMBAR SPINE (ICD-722.52) Assessment: Unchanged Narcoti dependent....  Patient Instructions: 1)  Copy sent to : Dr. Seymour Bars 2)  Please continue current medications.  3)  Stop ultrase and trial of Xifaxan 550mg  two times a day and probiotic Rx... 4)  Please call our GI Office back in 2 weeks with a follow-up of symptoms. 5)  The medication list was reviewed and reconciled.  All changed / newly prescribed medications were explained.  A complete medication list was provided to the patient / caregiver.

## 2010-11-25 NOTE — Procedures (Signed)
Summary: Upper Endoscopy  Patient: General Wearing Note: All result statuses are Final unless otherwise noted.  Tests: (1) Upper Endoscopy (EGD)   EGD Upper Endoscopy       DONE     [facility]     [address]     [place], [place]  [address]           ENDOSCOPY PROCEDURE REPORT           PATIENT:  James Dixon, James Dixon  MR#:  [id number]     BIRTHDATE:  Mar 06, 1956, 53 yrs. old  GENDER:  male           ENDOSCOPIST:  Vania Rea. Jarold Motto, MD, [facility]     Referred by:           PROCEDURE DATE:  11/04/2009     PROCEDURE:  EGD, diagnostic     ASA CLASS:  Class III     INDICATIONS:  chronic gerd           MEDICATIONS:   glycopyrrolate (Robinal) 0.2 mg IM     TOPICAL ANESTHETIC:  Exactacain Spray           DESCRIPTION OF PROCEDURE:   After the risks benefits and     alternatives of the procedure were thoroughly explained, informed     consent was obtained.  The LB GIF-H180 [id number] endoscope was     introduced through the mouth and advanced to the second portion of     the duodenum, without limitations.  The instrument was slowly     withdrawn as the mucosa was fully examined.     <<PROCEDUREIMAGES>>           A hiatal hernia was found. 3-4cm.hh  Normal duodenal folds were     noted.  Normal GE junction was noted.  The esophagus and     gastroesophageal junction were completely normal in appearance.     The stomach was entered and closely examined. The antrum,     angularis, and lesser curvature were well visualized, including a     retroflexed view of the cardia and fundus. The stomach wall was     normally distensable. The scope passed easily through the pylorus     into the duodenum.    Retroflexed views revealed a hiatal hernia.     The scope was then withdrawn from the patient and the procedure     completed.           COMPLICATIONS:  None           ENDOSCOPIC IMPRESSION:     1) Hiatal hernia     2) Normal duodenal folds     3) Normal GE junction     4) Normal esophagus   5) Normal stomach     6) A hiatal hernia     chronic GERD on Rx.no Barrett's mucosa noted.     RECOMMENDATIONS:     1) continue PPI     2) anti-reflux regimen to be follow           REPEAT EXAM:  No           ______________________________     Vania Rea. Jarold Motto, MD, [facility]           CC:  Seymour Bars, DO           n.     eSIGNED:   Vania Rea. Patterson at 11/04/2009 11:54 AM  Kolter, Reaver, [id number]  Note: An exclamation mark (!) indicates a result that was not dispersed into the flowsheet. Document Creation Date: 11/04/2009 11:54 AM _______________________________________________________________________  (1) Order result status: Final Collection or observation date-time: 11/04/2009 11:48 Requested date-time:  Receipt date-time:  Reported date-time:  Referring Physician:   Ordering Physician: Sheryn Bison [phone]) Specimen Source:  Source: Launa Grill Order Number: [id number] Lab site:

## 2010-11-25 NOTE — Letter (Signed)
Summary: [facility]   [facility]   Imported By: Roderic Ovens 12/18/2009 09:37:03  _____________________________________________________________________  External Attachment:    Type:   Image     Comment:   External Document

## 2010-11-25 NOTE — Letter (Signed)
Summary: Peripheral Vascular  [facility], Main Office  [address]   [place], [place] [address]   Phone: [phone]  Fax: [phone]     11/11/2009 MRN: [id number]  James Dixon [address] [place], [place]  [address]  Dear Mr. Quilling,   You are scheduled for Peripheral Vascular Angiogram on Wednesday November 13, 2009 with Dr. Excell Seltzer.  Please arrive at the [facility] at 9:00      a.m. on the day of your procedure.  1. DIET     _X___ Nothing to eat or drink after midnight except your medications with a sip of water.  2. MAKE SURE YOU TAKE YOUR ASPIRIN.  3. __X___ DO NOT TAKE these medications before your procedure:      DO NOT TAKE METFORMIN THE NIGHT BEFORE, MORNING OF, OR 48 HOURS AFTER PROCEDURE         __x__ YOU MAY TAKE ALL of your remaining medications with a small amount of water.      4. Plan for one night stay - bring personal belongings (i.e. toothpaste, toothbrush, etc.)  5. Bring a current list of your medications and current insurance cards.  6. Must have a responsible person to drive you home.   7. Someone must be with yOu for the first 24 hours after you arrive home.  8. Please wear clothes that are easy to get on and off and wear slip-on shoes.  *Special note: Every effort is made to have your procedure done on time.  Occasionally there are emergencies that present themselves at the hospital that may cause delays.  Please be patient if a delay does occur.  If you have any questions after you get home, please call the office at the number listed above.   Julieta Gutting, RN, BSN

## 2010-11-28 NOTE — Cardiovascular Report (Signed)
Summary: Pre Op Orders  Pre Op Orders   Imported By: Roderic Ovens 11/06/2009 11:18:59  _____________________________________________________________________  External Attachment:    Type:   Image     Comment:   External Document

## 2010-11-28 NOTE — Consult Note (Signed)
Summary: [facility]   [facility]   Imported By: Roderic Ovens 11/22/2009 13:10:48  _____________________________________________________________________  External Attachment:    Type:   Image     Comment:   External Document

## 2010-12-22 ENCOUNTER — Encounter: Payer: Self-pay | Admitting: Family Medicine

## 2011-01-11 LAB — URINALYSIS, ROUTINE W REFLEX MICROSCOPIC
Bilirubin Urine: NEGATIVE
Bilirubin Urine: NEGATIVE
Glucose, UA: 250 mg/dL — AB
Hgb urine dipstick: NEGATIVE
Hgb urine dipstick: NEGATIVE
Ketones, ur: NEGATIVE mg/dL
Protein, ur: NEGATIVE mg/dL
Protein, ur: NEGATIVE mg/dL
Specific Gravity, Urine: 1.023 (ref 1.005–1.030)
Urobilinogen, UA: 0.2 mg/dL (ref 0.0–1.0)

## 2011-01-11 LAB — GLUCOSE, CAPILLARY
Glucose-Capillary: 127 mg/dL — ABNORMAL HIGH (ref 70–99)
Glucose-Capillary: 150 mg/dL — ABNORMAL HIGH (ref 70–99)
Glucose-Capillary: 150 mg/dL — ABNORMAL HIGH (ref 70–99)
Glucose-Capillary: 157 mg/dL — ABNORMAL HIGH (ref 70–99)
Glucose-Capillary: 177 mg/dL — ABNORMAL HIGH (ref 70–99)
Glucose-Capillary: 227 mg/dL — ABNORMAL HIGH (ref 70–99)
Glucose-Capillary: 233 mg/dL — ABNORMAL HIGH (ref 70–99)
Glucose-Capillary: 241 mg/dL — ABNORMAL HIGH (ref 70–99)
Glucose-Capillary: 246 mg/dL — ABNORMAL HIGH (ref 70–99)

## 2011-01-11 LAB — BASIC METABOLIC PANEL
BUN: 5 mg/dL — ABNORMAL LOW (ref 6–23)
Calcium: 9 mg/dL (ref 8.4–10.5)
Creatinine, Ser: 0.8 mg/dL (ref 0.4–1.5)
GFR calc non Af Amer: >60 mL/min (ref >60)
Glucose, Bld: 184 mg/dL — ABNORMAL HIGH (ref 70–99)

## 2011-01-11 LAB — COMPREHENSIVE METABOLIC PANEL
Alkaline Phosphatase: 95 U/L (ref 39–117)
BUN: 17 mg/dL (ref 6–23)
GFR calc non Af Amer: >60 mL/min (ref >60)
Glucose, Bld: 203 mg/dL — ABNORMAL HIGH (ref 70–99)
Potassium: 4.5 mEq/L (ref 3.5–5.1)
Total Bilirubin: 0.3 mg/dL (ref 0.3–1.2)
Total Protein: 6.6 g/dL (ref 6.0–8.3)

## 2011-01-11 LAB — POCT I-STAT 4, (NA,K, GLUC, HGB,HCT)
HCT: 37 % — ABNORMAL LOW (ref 39.0–52.0)
Sodium: 135 mEq/L (ref 135–145)

## 2011-01-11 LAB — CBC
Hemoglobin: 14.6 g/dL (ref 13.0–17.0)
MCHC: 35.9 g/dL (ref 30.0–36.0)
Platelets: 255 10*3/uL (ref 150–400)
RBC: 4.42 MIL/uL (ref 4.22–5.81)
RDW: 13.1 % (ref 11.5–15.5)

## 2011-01-11 LAB — ABO/RH: ABO/RH(D): O POS

## 2011-01-11 LAB — PROTIME-INR: Prothrombin Time: 12.4 seconds (ref 11.6–15.2)

## 2011-01-13 NOTE — Letter (Signed)
Summary: Medical Necessity for Oxygen/[facility]  Medical Necessity for Oxygen/[facility]   Imported By: Maryln Gottron 01/06/2011 10:46:09  _____________________________________________________________________  External Attachment:    Type:   Image     Comment:   External Document

## 2011-03-10 NOTE — Assessment & Plan Note (Signed)
OFFICE VISIT   NHIA, HEAPHY  DOB:  Feb 19, 1956                                       12/05/2009  EQAST#:[id number]   I saw the patient in the office today for followup after recent right  fem-pop bypass.  This is a 55 year old gentleman with a history of  bilateral lower extremity claudication.  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 Excell Seltzer for evaluation for fem-pop bypass grafting.  He was felt to  be a reasonable candidate and underwent a right femoral to above-knee  popliteal bypass with a vein graft on 11/20/2009.  He did well  postoperatively and was discharged on 11/22/2009.  He comes in for his  first outpatient visit.  He states that his claudication symptoms on the  right leg have resolved.  He has some mild incisional pain.  He has had  no chest pain or chest pressure.   PHYSICAL EXAMINATION:  This is a pleasant 55 year old gentleman who  appears his stated age.  Temperature is 98.4, blood pressure 137/85,  heart rate is 62.  His incisions are all healed nicely in the right leg.  He has some mild right lower extremity swelling.  He has a palpable  femoral, popliteal and dorsalis pedis pulse on the right side.  His ABI  is 90% on the right with biphasic Doppler signals in the dorsalis pedis  and posterior tibial positions.  Of note, his preoperative ABI on the  right was 77%.   Overall, I am pleased his progress.  I have encouraged him to do as much  walking as possible and have also discussed the importance of tobacco  cessation.  We will follow his graft closely on our protocol.  I will  plan on seeing him back in 3 months.  He knows to call sooner if he has  problems.     Di Kindle. Edilia Bo, M.D.  Electronically Signed   CSD/MEDQ  D:  12/05/2009  T:  12/06/2009  Job:  [id number]   cc:   Veverly Fells. Excell Seltzer, MD  Seymour Bars, D.O.
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
## 2011-03-13 NOTE — H&P (Signed)
NAME:  James Dixon, James Dixon NO.:  [id number]   MEDICAL RECORD NO.:  [id number]          PATIENT TYPE:  OBV   LOCATION:  6526                         FACILITY:  [facility]   PHYSICIAN:  Hettie Holstein, D.O.    DATE OF BIRTH:  1955-11-29   DATE OF ADMISSION:  01/20/2007  DATE OF DISCHARGE:                              HISTORY & PHYSICAL   PRIMARY CARE PHYSICIAN:  Dr. Arlyce Dice.   CHIEF COMPLAINT:  Chest pain.   HISTORY OF PRESENT ILLNESS:  James Dixon is a pleasant 55 year old male  with a medical history significant for diabetes and hypertension and  hypercholesterolemia as well as tobacco abuse and dependence who has  been having some chronic sharp chest pain.  He states that this has been  occurring for three to four years.  It got a little bit worse recently.  He states this is left parasternal, very mild discomfort and pain not  associated with shortness of breath or radiation, nausea or vomiting or  diaphoresis.  There was no exertional component to this.  He did have a  recent chest cold and cough, but otherwise his state of health has been  usual.  In the emergency department, it was discerned that he was sent  from his primary physician's office for admission and to rule out an  acute cardiac event.  His point of care markers were negative in the  emergency department and the EKG was not suggestive of an acute MI or  acute ischemia.   PAST MEDICAL HISTORY:  As noted above, hypertension and diabetes, for  which he has been undergoing treatment for five years, and  hypercholesterolemia.  Has had some right shoulder rotator cuff surgery.   MEDICATIONS:  For his medications at home, he does not know the dosages.  He does go to [employer] on [address].  1. Lipitor 20 mg daily.  2. Lotrel, does not know the dose daily.  3. Metformin 500 mg b.i.d.  4. Robaxin p.r.n.  5. Vicodin 7.5, also on a p.r.n. basis.   ALLERGIES:  ALLERGIES TO CODEINE.   SOCIAL HISTORY:  He does  smoke about a pack of cigarettes per day and  has been doing this since he was in his teens.  Drinks only occasional  alcohol.  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   FAMILY HISTORY:  Noncontributory.   SOCIAL HISTORY:  As noted above.  He is married.  He does have one  daughter and two stepdaughters.   REVIEW OF SYSTEMS:  He has been in his usual state of health.  He does  report some weight fluctuations with weight gain specifically and  some  abdominal distension.  No swelling of his lower extremities.  He has had  a recent upper respiratory tract infection and upon further review, this  is unremarkable.  No nausea and vomiting or diarrhea.   PHYSICAL EXAMINATION:  VITAL SIGNS:  In the emergency department, his  blood pressure was 130/83.  Temperature of 97.1.  Respirations 12.  Heart rate  92.  O2 saturation is 95% on room air.  GENERAL:  The patient is awake, alert and oriented and in no acute  distress.  HEENT:  Reveals head to be normocephalic, atraumatic.  Extraocular  muscles are intact.  Neck is supple, nontender without lymphadenopathy,  thyromegaly or mass.  CARDIOVASCULAR:  Reveals normal S1, S2.  RESPIRATORY:  Lungs are clear to auscultation bilaterally.  ABDOMEN:  Soft and nontender, no rebound or guarding.  EXTREMITIES:  No edema.  NEUROLOGIC EXAM:  Reveals him to be euthymic.  His affect is flat.  Otherwise he is answering questions appropriately.   LABORATORY DATA:  Laboratory data revealed his sodium to be 134,  potassium of 4, BUN 8, creatinine 0.8, glucose of 71.  Chest x-ray  revealed no active disease and some bronchiectic changes.  Point of care  markers were negative as well.  EKG revealed a normal sinus rhythm as  noted above without acute findings.   ASSESSMENT:  1. Atypical chest pain.  2. Diabetes mellitus.  3. Hypertension.  4. Tobacco abuse.  5. Obesity.   PLAN:  At this time, I will admit Mr.  [name] for 23-hour observation  and serial cardiac markers.  If these are negative, he can pursue  further cardiac risk stratification in the outpatient setting and  optimization of his risk factor management.      Hettie Holstein, D.O.  Electronically Signed     ESS/MEDQ  D:  01/20/2007  T:  01/21/2007  Job:  [id number]   cc:   Teena Irani. Arlyce Dice, M.D.

## 2011-06-09 ENCOUNTER — Encounter: Payer: Self-pay | Admitting: Cardiology

## 2012-05-26 ENCOUNTER — Encounter: Payer: Self-pay | Admitting: Vascular Surgery

## 2014-07-03 ENCOUNTER — Encounter: Payer: Self-pay | Admitting: Gastroenterology

## 2015-03-12 ENCOUNTER — Encounter: Payer: Self-pay | Admitting: Internal Medicine

## 2015-12-20 ENCOUNTER — Emergency Department
Admission: EM | Admit: 2015-12-20 | Discharge: 2015-12-20 | Disposition: A | Discharge status: Home / Self Care | Attending: Emergency Medicine | Admitting: Emergency Medicine

## 2015-12-20 ENCOUNTER — Encounter: Payer: Self-pay | Admitting: *Deleted

## 2015-12-20 DIAGNOSIS — R739 Hyperglycemia, unspecified: Secondary | ICD-10-CM

## 2015-12-20 DIAGNOSIS — Z79899 Other long term (current) drug therapy: Secondary | ICD-10-CM | POA: Insufficient documentation

## 2015-12-20 DIAGNOSIS — Z7982 Long term (current) use of aspirin: Secondary | ICD-10-CM | POA: Diagnosis not present

## 2015-12-20 DIAGNOSIS — I10 Essential (primary) hypertension: Secondary | ICD-10-CM | POA: Diagnosis not present

## 2015-12-20 DIAGNOSIS — Z7984 Long term (current) use of oral hypoglycemic drugs: Secondary | ICD-10-CM | POA: Insufficient documentation

## 2015-12-20 DIAGNOSIS — E1165 Type 2 diabetes mellitus with hyperglycemia: Secondary | ICD-10-CM | POA: Insufficient documentation

## 2015-12-20 DIAGNOSIS — M549 Dorsalgia, unspecified: Secondary | ICD-10-CM | POA: Diagnosis not present

## 2015-12-20 DIAGNOSIS — G8929 Other chronic pain: Secondary | ICD-10-CM | POA: Insufficient documentation

## 2015-12-20 DIAGNOSIS — F17200 Nicotine dependence, unspecified, uncomplicated: Secondary | ICD-10-CM | POA: Insufficient documentation

## 2015-12-20 HISTORY — DX: Malignant (primary) neoplasm, unspecified: C80.1

## 2015-12-20 LAB — BASIC METABOLIC PANEL
Anion gap: 11 (ref 5–15)
BUN: 22 mg/dL — ABNORMAL HIGH (ref 6–20)
CALCIUM: 9.4 mg/dL (ref 8.9–10.3)
CHLORIDE: 90 mmol/L — AB (ref 101–111)
CO2: 28 mmol/L (ref 22–32)
Creatinine, Ser: 1.07 mg/dL (ref 0.61–1.24)
GFR calc non Af Amer: >60 mL/min (ref >60)
Glucose, Bld: 659 mg/dL (ref 65–99)
Potassium: 4.3 mmol/L (ref 3.5–5.1)
Sodium: 129 mmol/L — ABNORMAL LOW (ref 135–145)

## 2015-12-20 LAB — CBC
HEMATOCRIT: 40.9 % (ref 40.0–52.0)
HEMOGLOBIN: 13.5 g/dL (ref 13.0–18.0)
MCH: 29.4 pg (ref 26.0–34.0)
MCHC: 32.9 g/dL (ref 32.0–36.0)
MCV: 89.1 fL (ref 80.0–100.0)
Platelets: 320 10*3/uL (ref 150–440)
RBC: 4.59 MIL/uL (ref 4.40–5.90)
RDW: 15.8 % — ABNORMAL HIGH (ref 11.5–14.5)
WBC: 14.9 10*3/uL — ABNORMAL HIGH (ref 3.8–10.6)

## 2015-12-20 LAB — URINALYSIS COMPLETE WITH MICROSCOPIC (ARMC ONLY)
BACTERIA UA: NONE SEEN
Bilirubin Urine: NEGATIVE
Hgb urine dipstick: NEGATIVE
Ketones, ur: NEGATIVE mg/dL
Leukocytes, UA: NEGATIVE
Nitrite: NEGATIVE
Protein, ur: NEGATIVE mg/dL
Specific Gravity, Urine: 1.026 (ref 1.005–1.030)
WBC UA: NONE SEEN WBC/hpf (ref 0–5)
pH: 6 (ref 5.0–8.0)

## 2015-12-20 LAB — GLUCOSE, CAPILLARY
GLUCOSE-CAPILLARY: 263 mg/dL — AB (ref 65–99)
Glucose-Capillary: >600 mg/dL (ref 65–99)
Glucose-Capillary: 469 mg/dL — ABNORMAL HIGH (ref 65–99)
Glucose-Capillary: 400 mg/dL — ABNORMAL HIGH (ref 65–99)

## 2015-12-20 MED ORDER — SODIUM CHLORIDE 0.9 % IV BOLUS (SEPSIS)
1000.0000 mL | Freq: Once | INTRAVENOUS | Status: AC
  Administered 2015-12-20: 1000 mL via INTRAVENOUS

## 2015-12-20 MED ORDER — INSULIN ASPART 100 UNIT/ML ~~LOC~~ SOLN
8.0000 [IU] | Freq: Once | SUBCUTANEOUS | Status: AC
  Administered 2015-12-20: 8 [IU] via INTRAVENOUS
  Filled 2015-12-20: qty 8

## 2015-12-20 MED ORDER — HYDROMORPHONE HCL 1 MG/ML IJ SOLN
1.0000 mg | Freq: Once | INTRAMUSCULAR | Status: AC
  Administered 2015-12-20: 1 mg via INTRAVENOUS
  Filled 2015-12-20: qty 1

## 2015-12-20 MED ORDER — SODIUM CHLORIDE 0.9 % IV BOLUS (SEPSIS): 1000.0000 mL | Freq: Once | INTRAVENOUS | Status: DC

## 2015-12-20 NOTE — ED Provider Notes (Signed)
[facility] Emergency Department Provider Note  Time seen: 9:11 PM  I have reviewed the triage vital signs and the nursing notes.   HISTORY  Chief Complaint Hyperglycemia    HPI SAVIYON BEEBE is a 60 y.o. 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 According to the patient for the past one week his blood sugars have been running in the 400-500 range. After speaking to the patient he started dexamethasone one week ago due to increased bone pain due to his cancer for which he is on hospice care. States back pain but he states this is chronic denies any acute worsening. Patient denies any recent cough, congestion, fever, dysuria. Patient was started on dexamethasone one week ago for increased bone pain per patient.     Past Medical History  Diagnosis Date  . CAD (coronary artery disease)     with total RCA< and moderate LAD/LCx stenosis, medical Rx  . PVD (peripheral vascular disease) ([place])   . Essential hypertension     benign  . HLD (hyperlipidemia)   . Lumbago   . GERD (gastroesophageal reflux disease)   . COPD (chronic obstructive pulmonary disease) ([place])   . DDD (degenerative disc disease), lumbar   . COPD (chronic obstructive pulmonary disease) ([place])   . DM2 (diabetes mellitus, type 2) ([place])   . Nephrolithiasis   . Cancer [facility])     Patient Active Problem List   Diagnosis Date Noted  . CAD, NATIVE VESSEL 11/12/2009  . ATHEROSCLEROSIS W /INT CLAUDICATION 11/12/2009  . CARDIOVASCULAR FUNCTION STUDY, ABNORMAL 10/30/2009  . ASTHMA 10/24/2009  . NAUSEA 10/24/2009  . ABDOMINAL PAIN, LOWER 10/24/2009  . HYPOPARATHYROIDISM 10/03/2009  . HYPERCALCEMIA 10/02/2009  . OLECRANON BURSITIS, RIGHT 09/16/2009  . HYPERCHOLESTEROLEMIA 09/06/2009  . Peripheral vascular disease, unspecified ([place]) 09/02/2009  . CHEST PAIN  09/02/2009  . Flatulence, eructation, and gas pain 09/02/2009  . PNEUMONIA, ORGANISM UNSPECIFIED 06/12/2009  . GERD 04/25/2009  . LUMBAGO 04/25/2009  . DIABETES MELLITUS, TYPE II, WITHOUT COMPLICATIONS [id number]  . HYPERLIPIDEMIA 01/29/2009  . MORBID OBESITY 01/29/2009  . CIGARETTE SMOKER 01/29/2009  . ESSENTIAL HYPERTENSION, BENIGN 01/29/2009  . COPD 01/29/2009  . DEGENERATIVE DISC DISEASE, LUMBAR SPINE 01/29/2009    Past Surgical History  Procedure Laterality Date  . R rtc repair    . Bilateral salpingoophorectomy      Current Outpatient Rx  Name  Route  Sig  Dispense  Refill  . amylase-lipase-protease (ULTRASE MT 20) 65-20-65 MU per capsule   Oral   Take by mouth 3 (three) times daily with meals.           Marland Kitchen aspirin 81 MG tablet   Oral   Take 81 mg by mouth daily.           Marland Kitchen glucose blood test strip   Other   1 each by Other route as directed. Use as instructed          . HYDROcodone-acetaminophen (LORCET) 10-650 MG per tablet   Oral   Take 1 tablet by mouth every 6 (six) hours as needed.           Marland Kitchen lisinopril-hydrochlorothiazide (PRINZIDE,ZESTORETIC) 20-25 MG per tablet   Oral   Take 1 tablet by mouth daily.           . metFORMIN (GLUCOPHAGE) 1000 MG tablet   Oral   Take 1,000  mg by mouth 2 (two) times daily.           . metFORMIN (GLUMETZA) 1000 MG (MOD) 24 hr tablet   Oral   Take 1,000 mg by mouth 2 (two) times daily.           . NON FORMULARY      Supplemental Humidified Oxygen VIA nasal Canula: 2L/minute Dx: COPD Fax to [facility]          . omeprazole (PRILOSEC) 40 MG capsule   Oral   Take 40 mg by mouth daily. 30 min before breakfast          . pravastatin (PRAVACHOL) 40 MG tablet   Oral   Take 40 mg by mouth daily.           . rifaximin (XIFAXAN) 550 MG TABS   Oral   Take 550 mg by mouth 2 (two) times daily. x10 days          . simvastatin (ZOCOR) 40 MG tablet   Oral   Take 40 mg by mouth at bedtime.              Allergies Amitriptyline hcl and Codeine  Family History  Problem Relation Age of Onset  . Brain cancer Mother   . Cancer Father   . Lung cancer Sister   . Diabetes Sister   . Hypertension Sister   . Hyperlipidemia Sister     Social History Social History  Substance Use Topics  . Smoking status: Current Every Day Smoker  . Smokeless tobacco: None     Comment: smokes 2ppd x 41 yrs.   . 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 Cardiovascular: Negative for chest pain. Respiratory: Negative for shortness of breath. Gastrointestinal: Negative for abdominal pain Genitourinary: Negative for dysuria.  Musculoskeletal: Positive for back pain, chronic Neurological: Negative for headache 10-point ROS otherwise negative.  ____________________________________________   PHYSICAL EXAM:  VITAL SIGNS: ED Triage Vitals  Enc Vitals Group     BP 12/20/15 1917 164/76 mmHg     Pulse Rate 12/20/15 1917 116     Resp 12/20/15 1917 18     Temp 12/20/15 1917 98.2 F (36.8 C)     Temp Source 12/20/15 1917 Oral     SpO2 12/20/15 1917 95 %     Weight 12/20/15 1917 159 lb (72.122 kg)     Height 12/20/15 1917 5\' 7"  (1.702 m)     Head Cir --      Peak Flow --      Pain Score 12/20/15 1918 6     Pain Loc --      Pain Edu? --      Excl. in [place]? --     Constitutional: Alert and oriented. Well appearing and in no distress. Eyes: Normal exam ENT   Head: Normocephalic and atraumatic.   Mouth/Throat: Mucous membranes are moist. Cardiovascular: Normal rate, regular rhythm. No murmur Respiratory: Normal respiratory effort without tachypnea nor retractions. Breath sounds are clear Gastrointestinal: Soft and nontender. No distention.  Musculoskeletal: Normal range of motion in extremities, nontender. Neurologic:  Normal speech and language. No gross focal neurologic deficits Skin:  Skin is warm, dry and intact.   Psychiatric: Mood and affect are normal. Speech and behavior are normal.  ____________________________________________    INITIAL IMPRESSION / ASSESSMENT AND PLAN / ED COURSE  Pertinent labs & imaging results that were available during  my care of the patient were reviewed by me and considered in my medical decision making (see chart for details).  Patient presents the emergency department elevated blood glucose levels over the past one week. Tonight patient's blood glucose was greater than 500 so they came to the emergency department for evaluation. Upon my questioning it was found that the patient started dexamethasone approximately one week ago which is likely the cause of his hyperglycemia. Patient denies any infectious symptoms such as abdominal pain, cough, congestion, fever, dysuria. Patient is in no distress, states he is hungry asking for something to eat, states he is thirsty asking for something to drink. We will continue with IV hydration, dose insulin aspart IV, and continue to monitor. I discussed with patient need to follow-up with his primary care physician on Monday to reevaluate his current insulin regimen as a dexamethasone is a new long-term medication. In the meantime I discussed with the patient increasing his sliding scale by 2 units at each interval. Patient agreeable plan.  Patient's blood glucose currently 263. We will discharge home with primary care follow-up. We discussed increasing the patient's sliding scale insulin 2 units with each interval. Patient agreeable plan will see his primary care doctor on Monday.  ____________________________________________   FINAL CLINICAL IMPRESSION(S) / ED DIAGNOSES  Hyperglycemia   Harvest Dark, MD 12/20/15 2258

## 2015-12-20 NOTE — ED Notes (Addendum)
States CBG over 600 today, states yesterday his sugars were in the 500s, pt states he takes a sliding scale of Novolog, states hx of bone cancer

## 2015-12-20 NOTE — Discharge Instructions (Signed)
Please follow-up your primary care physician on Monday for reevaluation, and to discussed your current insulin regimen. Return to the emergency department for any personally concerning symptoms.   Hyperglycemia Hyperglycemia occurs when the glucose (sugar) in your blood is too high. Hyperglycemia can happen for many reasons, but it most often happens to people who do not know they have diabetes or are not managing their diabetes properly.  CAUSES  Whether you have diabetes or not, there are other causes of hyperglycemia. Hyperglycemia can occur when you have diabetes, but it can also occur in other situations that you might not be as aware of, such as: Diabetes  If you have diabetes and are having problems controlling your blood glucose, hyperglycemia could occur because of some of the following reasons:  Not following your meal plan.  Not taking your diabetes medications or not taking it properly.  Exercising less or doing less activity than you normally do.  Being sick. Pre-diabetes  This cannot be ignored. Before people develop Type 2 diabetes, they almost always have "pre-diabetes." This is when your blood glucose levels are higher than normal, but not yet high enough to be diagnosed as diabetes. Research has shown that some long-term damage to the body, especially the heart and circulatory system, may already be occurring during pre-diabetes. If you take action to manage your blood glucose when you have pre-diabetes, you may delay or prevent Type 2 diabetes from developing. Stress  If you have diabetes, you may be "diet" controlled or on oral medications or insulin to control your diabetes. However, you may find that your blood glucose is higher than usual in the hospital whether you have diabetes or not. This is often referred to as "stress hyperglycemia." Stress can elevate your blood glucose. This happens because of hormones put out by the body during times of stress. If stress has  been the cause of your high blood glucose, it can be followed regularly by your caregiver. That way he/she can make sure your hyperglycemia does not continue to get worse or progress to diabetes. Steroids  Steroids are medications that act on the infection fighting system (immune system) to block inflammation or infection. One side effect can be a rise in blood glucose. Most people can produce enough extra insulin to allow for this rise, but for those who cannot, steroids make blood glucose levels go even higher. It is not unusual for steroid treatments to "uncover" diabetes that is developing. It is not always possible to determine if the hyperglycemia will go away after the steroids are stopped. A special blood test called an A1c is sometimes done to determine if your blood glucose was elevated before the steroids were started. SYMPTOMS  Thirsty.  Frequent urination.  Dry mouth.  Blurred vision.  Tired or fatigue.  Weakness.  Sleepy.  Tingling in feet or leg. DIAGNOSIS  Diagnosis is made by monitoring blood glucose in one or all of the following ways:  A1c test. This is a chemical found in your blood.  Fingerstick blood glucose monitoring.  Laboratory results. TREATMENT  First, knowing the cause of the hyperglycemia is important before the hyperglycemia can be treated. Treatment may include, but is not be limited to:  Education.  Change or adjustment in medications.  Change or adjustment in meal plan.  Treatment for an illness, infection, etc.  More frequent blood glucose monitoring.  Change in exercise plan.  Decreasing or stopping steroids.  Lifestyle changes. HOME CARE INSTRUCTIONS   Test your blood  glucose as directed.  Exercise regularly. Your caregiver will give you instructions about exercise. Pre-diabetes or diabetes which comes on with stress is helped by exercising.  Eat wholesome, balanced meals. Eat often and at regular, fixed times. Your caregiver  or nutritionist will give you a meal plan to guide your sugar intake.  Being at an ideal weight is important. If needed, losing as little as 10 to 15 pounds may help improve blood glucose levels. SEEK MEDICAL CARE IF:   You have questions about medicine, activity, or diet.  You continue to have symptoms (problems such as increased thirst, urination, or weight gain). SEEK IMMEDIATE MEDICAL CARE IF:   You are vomiting or have diarrhea.  Your breath smells fruity.  You are breathing faster or slower.  You are very sleepy or incoherent.  You have numbness, tingling, or pain in your feet or hands.  You have chest pain.  Your symptoms get worse even though you have been following your caregiver's orders.  If you have any other questions or concerns.   This information is not intended to replace advice given to you by your health care provider. Make sure you discuss any questions you have with your health care provider.   Document Released: 04/07/2001 Document Revised: 01/04/2012 Document Reviewed: 06/18/2015 Elsevier Interactive Patient Education [employer].

## 2015-12-22 ENCOUNTER — Emergency Department

## 2015-12-22 ENCOUNTER — Emergency Department
Admission: EM | Admit: 2015-12-22 | Discharge: 2015-12-22 | Disposition: A | Discharge status: Home / Self Care | Attending: Emergency Medicine | Admitting: Emergency Medicine

## 2015-12-22 ENCOUNTER — Encounter: Payer: Self-pay | Admitting: Emergency Medicine

## 2015-12-22 DIAGNOSIS — I10 Essential (primary) hypertension: Secondary | ICD-10-CM | POA: Diagnosis not present

## 2015-12-22 DIAGNOSIS — J441 Chronic obstructive pulmonary disease with (acute) exacerbation: Secondary | ICD-10-CM | POA: Diagnosis not present

## 2015-12-22 DIAGNOSIS — Z79891 Long term (current) use of opiate analgesic: Secondary | ICD-10-CM | POA: Diagnosis not present

## 2015-12-22 DIAGNOSIS — Z79899 Other long term (current) drug therapy: Secondary | ICD-10-CM | POA: Diagnosis not present

## 2015-12-22 DIAGNOSIS — R739 Hyperglycemia, unspecified: Secondary | ICD-10-CM

## 2015-12-22 DIAGNOSIS — Z7952 Long term (current) use of systemic steroids: Secondary | ICD-10-CM | POA: Insufficient documentation

## 2015-12-22 DIAGNOSIS — Z794 Long term (current) use of insulin: Secondary | ICD-10-CM | POA: Insufficient documentation

## 2015-12-22 DIAGNOSIS — F17200 Nicotine dependence, unspecified, uncomplicated: Secondary | ICD-10-CM | POA: Insufficient documentation

## 2015-12-22 DIAGNOSIS — E1165 Type 2 diabetes mellitus with hyperglycemia: Secondary | ICD-10-CM | POA: Insufficient documentation

## 2015-12-22 LAB — URINALYSIS COMPLETE WITH MICROSCOPIC (ARMC ONLY)
BACTERIA UA: NONE SEEN
BILIRUBIN URINE: NEGATIVE
Glucose, UA: >500 mg/dL — AB
HGB URINE DIPSTICK: NEGATIVE
Ketones, ur: NEGATIVE mg/dL
LEUKOCYTES UA: NEGATIVE
NITRITE: NEGATIVE
PH: 6 (ref 5.0–8.0)
Protein, ur: NEGATIVE mg/dL
RBC / HPF: NONE SEEN RBC/hpf (ref 0–5)
Specific Gravity, Urine: 1.024 (ref 1.005–1.030)
Squamous Epithelial / LPF: NONE SEEN

## 2015-12-22 LAB — CBC
HEMATOCRIT: 39.3 % — AB (ref 40.0–52.0)
HEMOGLOBIN: 13 g/dL (ref 13.0–18.0)
MCH: 29.5 pg (ref 26.0–34.0)
MCHC: 33 g/dL (ref 32.0–36.0)
MCV: 89.4 fL (ref 80.0–100.0)
Platelets: 274 10*3/uL (ref 150–440)
RBC: 4.4 MIL/uL (ref 4.40–5.90)
RDW: 16.5 % — AB (ref 11.5–14.5)
WBC: 10.8 10*3/uL — AB (ref 3.8–10.6)

## 2015-12-22 LAB — BASIC METABOLIC PANEL
ANION GAP: 10 (ref 5–15)
BUN: 27 mg/dL — ABNORMAL HIGH (ref 6–20)
CO2: 26 mmol/L (ref 22–32)
Calcium: 9.8 mg/dL (ref 8.9–10.3)
Chloride: 95 mmol/L — ABNORMAL LOW (ref 101–111)
Creatinine, Ser: 1.19 mg/dL (ref 0.61–1.24)
Glucose, Bld: 719 mg/dL (ref 65–99)
POTASSIUM: 4.8 mmol/L (ref 3.5–5.1)
SODIUM: 131 mmol/L — AB (ref 135–145)

## 2015-12-22 LAB — GLUCOSE, CAPILLARY
Glucose-Capillary: 475 mg/dL — ABNORMAL HIGH (ref 65–99)
Glucose-Capillary: >600 mg/dL (ref 65–99)

## 2015-12-22 IMAGING — DX DG CHEST 1V PORT
1 series · 1 of 1 positions shown · non-contrast
Comparison: [DATE]

CLINICAL DATA: Chest pain, back pain, and shortness of breath for 2
days.

EXAM:
PORTABLE CHEST 1 VIEW

[chest ap]
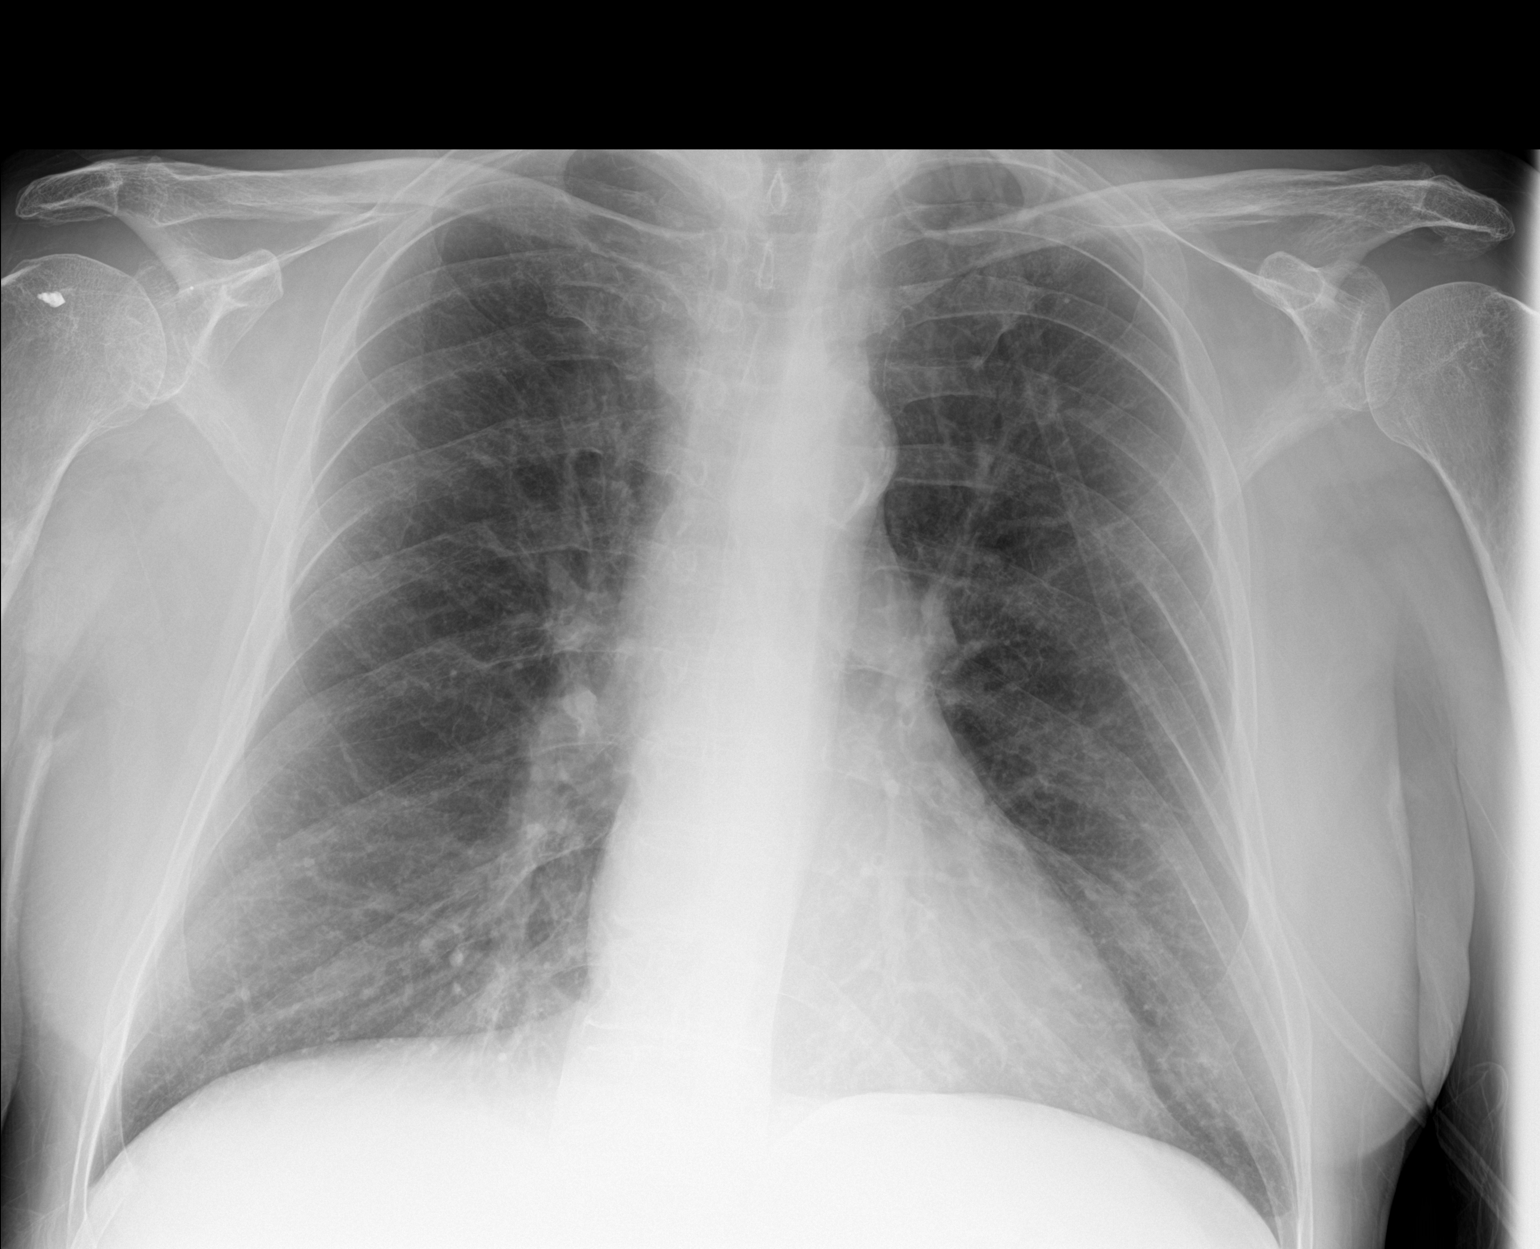

[1 of 1 positions shown; findings below may reference images not displayed]

FINDINGS: Hyperinflation with probable emphysema. The cardiomediastinal
contours are normal. Pulmonary vasculature is normal. No
consolidation, pleural effusion, or pneumothorax. No acute osseous
abnormalities are seen. Surgical anchor in the right shoulder.
IMPRESSION: Chronic hyperinflation and emphysema. No superimposed acute process.

## 2015-12-22 MED ORDER — MORPHINE SULFATE (PF) 4 MG/ML IV SOLN
4.0000 mg | Freq: Once | INTRAVENOUS | Status: AC
  Administered 2015-12-22: 4 mg via INTRAVENOUS
  Filled 2015-12-22: qty 1

## 2015-12-22 MED ORDER — IPRATROPIUM-ALBUTEROL 0.5-2.5 (3) MG/3ML IN SOLN
3.0000 mL | Freq: Once | RESPIRATORY_TRACT | Status: AC
  Administered 2015-12-22: 3 mL via RESPIRATORY_TRACT
  Filled 2015-12-22: qty 3

## 2015-12-22 MED ORDER — SODIUM CHLORIDE 0.9 % IV BOLUS (SEPSIS)
1000.0000 mL | Freq: Once | INTRAVENOUS | Status: AC
  Administered 2015-12-22: 1000 mL via INTRAVENOUS

## 2015-12-22 MED ORDER — IPRATROPIUM-ALBUTEROL 0.5-2.5 (3) MG/3ML IN SOLN
RESPIRATORY_TRACT | Status: AC
  Administered 2015-12-22: 3 mL via RESPIRATORY_TRACT
  Filled 2015-12-22: qty 3

## 2015-12-22 NOTE — ED Notes (Signed)
Pt reports he took insulin as prescribed after going home last night. PT reports blood glucose continued to rise throughout the day and before bed blood glucose was greater than 600.

## 2015-12-22 NOTE — ED Notes (Addendum)
Pt reminded that urine is needed. Cup set at bedside.

## 2015-12-22 NOTE — ED Notes (Signed)

## 2015-12-22 NOTE — ED Notes (Signed)
Pt states is here for hyperglycemia. Pt states sugars have been over 600 at home. Pt denies other symptoms. Pt appears in no acute distress in triage.

## 2015-12-22 NOTE — ED Provider Notes (Signed)
[facility] Emergency Department Provider Note  ____________________________________________  Time seen: Approximately 2:34 AM  I have reviewed the triage vital signs and the nursing notes.   HISTORY  Chief Complaint Hyperglycemia    HPI James Dixon is a 60 y.o. male with a history of bone cancer for which he is on hospice but states that he does not see a doctor regularly.  He presents tonight for evaluation of hyperglycemia.  He has been seen recently for the same issue.  He says that he is under hospice care but that his doctor does not see him regularly and he does not have any help managing his diabetes.  He has been using his insulin but says that since he was prescribed steroids his sugar has been difficult to control.  He has chronic shortness of breath.  He denies fever/chills, chest pain, abdominal pain, nausea, vomiting, and polyuria compared to his baseline.  He has no headache and no visual changes.   Past Medical History  Diagnosis Date  . CAD (coronary artery disease)     with total RCA< and moderate LAD/LCx stenosis, medical Rx  . PVD (peripheral vascular disease) ([place])   . Essential hypertension     benign  . HLD (hyperlipidemia)   . Lumbago   . GERD (gastroesophageal reflux disease)   . COPD (chronic obstructive pulmonary disease) ([place])   . DDD (degenerative disc disease), lumbar   . COPD (chronic obstructive pulmonary disease) ([place])   . DM2 (diabetes mellitus, type 2) ([place])   . Nephrolithiasis   . Cancer [facility])     Patient Active Problem List   Diagnosis Date Noted  . CAD, NATIVE VESSEL 11/12/2009  . ATHEROSCLEROSIS W /INT CLAUDICATION 11/12/2009  . CARDIOVASCULAR FUNCTION STUDY, ABNORMAL 10/30/2009  . ASTHMA 10/24/2009  . NAUSEA 10/24/2009  . ABDOMINAL PAIN, LOWER 10/24/2009  . HYPOPARATHYROIDISM 10/03/2009  . HYPERCALCEMIA 10/02/2009  . OLECRANON BURSITIS, RIGHT 09/16/2009  . HYPERCHOLESTEROLEMIA 09/06/2009  . Peripheral  vascular disease, unspecified ([place]) 09/02/2009  . CHEST PAIN 09/02/2009  . Flatulence, eructation, and gas pain 09/02/2009  . PNEUMONIA, ORGANISM UNSPECIFIED 06/12/2009  . GERD 04/25/2009  . LUMBAGO 04/25/2009  . DIABETES MELLITUS, TYPE II, WITHOUT COMPLICATIONS [id number]  . HYPERLIPIDEMIA 01/29/2009  . MORBID OBESITY 01/29/2009  . CIGARETTE SMOKER 01/29/2009  . ESSENTIAL HYPERTENSION, BENIGN 01/29/2009  . COPD 01/29/2009  . DEGENERATIVE DISC DISEASE, LUMBAR SPINE 01/29/2009    Past Surgical History  Procedure Laterality Date  . R rtc repair    . Bilateral salpingoophorectomy      Current Outpatient Rx  Name  Route  Sig  Dispense  Refill  . DEXAMETHASONE PO   Oral   Take 1 tablet by mouth 2 (two) times daily.         Marland Kitchen gabapentin (NEURONTIN) 300 MG capsule   Oral   Take 300 mg by mouth 2 (two) times daily.         . Insulin Lispro (HUMALOG [place])   Subcutaneous   Inject 15 Units into the skin 2 (two) times daily. Long acting insulin         . insulin lispro (HUMALOG) 100 UNIT/ML injection   Subcutaneous   Inject into the skin 3 (three) times daily before meals. Per sliding scale         . lisinopril-hydrochlorothiazide (PRINZIDE,ZESTORETIC) 20-25 MG per tablet   Oral   Take 1 tablet by mouth daily.           Marland Kitchen  MORPHINE SULFATE PO   Oral   Take 1-2 tablets by mouth 3 (three) times daily. 2 tablets in the am, one tablet in the afternoon, and two tablets at night         . omeprazole (PRILOSEC) 40 MG capsule   Oral   Take 40 mg by mouth daily. 30 min before breakfast          . amylase-lipase-protease (ULTRASE MT 20) 65-20-65 MU per capsule   Oral   Take by mouth 3 (three) times daily with meals.           Marland Kitchen glucose blood test strip   Other   1 each by Other route as directed. Use as instructed          . HYDROcodone-acetaminophen (LORCET) 10-650 MG per tablet   Oral   Take 1 tablet by mouth every 6 (six) hours as needed.              Allergies Amitriptyline hcl and Codeine  Family History  Problem Relation Age of Onset  . Brain cancer Mother   . Cancer Father   . Lung cancer Sister   . Diabetes Sister   . Hypertension Sister   . Hyperlipidemia Sister     Social History Social History  Substance Use Topics  . Smoking status: Current Every Day Smoker  . Smokeless tobacco: Never Used     Comment: smokes 2ppd x 41 yrs.   . 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 ENT: No sore throat. Cardiovascular: Denies chest pain. Respiratory: Chronic shortness of breath, not worse than usual Gastrointestinal: No abdominal pain.  No nausea, no vomiting.  No diarrhea.  No constipation. Genitourinary: Negative for dysuria. Musculoskeletal: Negative for back pain. Skin: Negative for rash. Neurological: Negative for headaches, focal weakness or numbness.  10-point ROS otherwise negative.  ____________________________________________   PHYSICAL EXAM:  VITAL SIGNS: ED Triage Vitals  Enc Vitals Group     BP 12/22/15 0045 166/73 mmHg     Pulse Rate 12/22/15 0045 94     Resp 12/22/15 0045 18     Temp 12/22/15 0045 97.5 F (36.4 C)     Temp src --      SpO2 12/22/15 0045 96 %     Weight 12/22/15 0045 160 lb (72.576 kg)     Height 12/22/15 0045 5\' 7"  (1.702 m)     Head Cir --      Peak Flow --      Pain Score 12/22/15 0045 0     Pain Loc --      Pain Edu? --      Excl. in [place]? --     Constitutional: Alert and oriented. Chronically ill appearing Eyes: Conjunctivae are normal. PERRL. EOMI. Head: Atraumatic. Nose: No congestion/rhinnorhea. Mouth/Throat: Mucous membranes are moist.  Oropharynx non-erythematous. Neck: No stridor.   Cardiovascular: Normal rate, regular rhythm. Grossly normal heart sounds.  Good peripheral circulation. Respiratory: Normal respiratory effort.  Expiratory wheezing with coarse sounds in  bilateral lungs Gastrointestinal: Soft and nontender. No distention. No abdominal bruits. No CVA tenderness. Musculoskeletal: No lower extremity tenderness nor edema.  No joint effusions. Neurologic:  Normal speech and language. No gross focal neurologic deficits are appreciated.  Skin:  Skin is warm, dry and intact. No rash noted. Psychiatric: Mood and affect are normal. Speech and behavior are normal.  ____________________________________________  LABS (all labs ordered are listed, but only abnormal results are displayed)  Labs Reviewed  BASIC METABOLIC PANEL - Abnormal; Notable for the following:    Sodium 131 (*)    Chloride 95 (*)    Glucose, Bld 719 (*)    BUN 27 (*)    All other components within normal limits  CBC - Abnormal; Notable for the following:    WBC 10.8 (*)    HCT 39.3 (*)    RDW 16.5 (*)    All other components within normal limits  URINALYSIS COMPLETEWITH MICROSCOPIC ([facility] ONLY) - Abnormal; Notable for the following:    Color, Urine STRAW (*)    APPearance CLEAR (*)    Glucose, UA >500 (*)    All other components within normal limits  GLUCOSE, CAPILLARY - Abnormal; Notable for the following:    Glucose-Capillary >600 (*)    All other components within normal limits  GLUCOSE, CAPILLARY - Abnormal; Notable for the following:    Glucose-Capillary 475 (*)    All other components within normal limits  CBG MONITORING, ED   ____________________________________________  EKG  None ____________________________________________  RADIOLOGY   Dg Chest Portable 1 View  12/22/2015  CLINICAL DATA:  Chest pain, back pain, and shortness of breath for 2 days. EXAM: PORTABLE CHEST 1 VIEW COMPARISON:  10/30/2009 FINDINGS: Hyperinflation with probable emphysema. The cardiomediastinal contours are normal. Pulmonary vasculature is normal. No consolidation, pleural effusion, or pneumothorax. No acute osseous abnormalities are seen. Surgical anchor in the right shoulder.  IMPRESSION: Chronic hyperinflation and emphysema. No superimposed acute process. Electronically Signed   By: Jeb Levering M.D.   On: 12/22/2015 03:30    ____________________________________________   PROCEDURES  Procedure(s) performed: None  Critical Care performed: No ____________________________________________   INITIAL IMPRESSION / ASSESSMENT AND PLAN / ED COURSE  Pertinent labs & imaging results that were available during my care of the patient were reviewed by me and considered in my medical decision making (see chart for details).  The patient is chronically ill on hospice but has no acute complaints right now other than his elevated blood sugar.  He has been seen recently in the emergency Department for the same issue.  He presented tonight with a blood sugar on chemistry of over 700 but after 1 L of fluids it came down to the 400s.  He has no symptoms and a normal anion gap.  He takes daily dexamethasone as per his hospice instructions.  He has minimal education about diabetes management.  At this time I have no diagnoses for which I can justify admission.  He needs close follow-up with his hospice doctor and nurse to discuss adjustments to his insulin.  His hospice nurse, Horris Latino, called to discuss the case with me.  I stressed the need for close outpatient follow-up with his physician to discuss the diabetes management particularly while he is on dexamethasone.  She will pass the message along to his physician.  I also discussed this with him and his wife and they understand and agree with the need for outpatient follow-up.  Even though his blood sugar remains elevated, it is extremely labile, dropping 300 points with 1 L of fluids.  I will not provide additional intervention at this time to avoid dropping his blood sugar too low since he clearly lives at an elevated state and has no other symptoms or complications.  ____________________________________________  FINAL CLINICAL  IMPRESSION(S) / ED DIAGNOSES  Final diagnoses:  Hyperglycemia  NEW MEDICATIONS STARTED DURING THIS VISIT:  New Prescriptions   No medications on file      Note:  This document was prepared using Dragon voice recognition software and may include unintentional dictation errors.   Hinda Kehr, MD 12/22/15 [phone]

## 2015-12-22 NOTE — Discharge Instructions (Signed)
As we discussed, though your blood sugar is running high, it is not dangerous at this time.  It is likely more elevated than usual due to the steroids you have been taking.  Making adjustments in the Emergency Department (ED) and possibly causing your glucose level to drop too low is more dangerous than continuing your current medications at this time until you can follow up with your regular doctor.  I spoke with James Dixon (hospice nurse) and she will leave a message with Dr. Nyra Capes, the hospice physician, to pass along the word that you need additional medical management and diabetes education to help with your elevated blood sugar.  Please continue your medications and follow up with your regular doctor as recommended in these documents.  If you develop new or worsening symptoms that concern you, please return to the Emergency Department.   Hyperglycemia Hyperglycemia occurs when the glucose (sugar) in your blood is too high. Hyperglycemia can happen for many reasons, but it most often happens to people who do not know they have diabetes or are not managing their diabetes properly.  CAUSES  Whether you have diabetes or not, there are other causes of hyperglycemia. Hyperglycemia can occur when you have diabetes, but it can also occur in other situations that you might not be as aware of, such as: Diabetes  If you have diabetes and are having problems controlling your blood glucose, hyperglycemia could occur because of some of the following reasons:  Not following your meal plan.  Not taking your diabetes medications or not taking it properly.  Exercising less or doing less activity than you normally do.  Being sick. Pre-diabetes  This cannot be ignored. Before people develop Type 2 diabetes, they almost always have "pre-diabetes." This is when your blood glucose levels are higher than normal, but not yet high enough to be diagnosed as diabetes. Research has shown that some long-term damage to  the body, especially the heart and circulatory system, may already be occurring during pre-diabetes. If you take action to manage your blood glucose when you have pre-diabetes, you may delay or prevent Type 2 diabetes from developing. Stress  If you have diabetes, you may be "diet" controlled or on oral medications or insulin to control your diabetes. However, you may find that your blood glucose is higher than usual in the hospital whether you have diabetes or not. This is often referred to as "stress hyperglycemia." Stress can elevate your blood glucose. This happens because of hormones put out by the body during times of stress. If stress has been the cause of your high blood glucose, it can be followed regularly by your caregiver. That way he/she can make sure your hyperglycemia does not continue to get worse or progress to diabetes. Steroids  Steroids are medications that act on the infection fighting system (immune system) to block inflammation or infection. One side effect can be a rise in blood glucose. Most people can produce enough extra insulin to allow for this rise, but for those who cannot, steroids make blood glucose levels go even higher. It is not unusual for steroid treatments to "uncover" diabetes that is developing. It is not always possible to determine if the hyperglycemia will go away after the steroids are stopped. A special blood test called an A1c is sometimes done to determine if your blood glucose was elevated before the steroids were started. SYMPTOMS  Thirsty.  Frequent urination.  Dry mouth.  Blurred vision.  Tired or fatigue.  Weakness.  Sleepy.  Tingling in feet or leg. DIAGNOSIS  Diagnosis is made by monitoring blood glucose in one or all of the following ways:  A1c test. This is a chemical found in your blood.  Fingerstick blood glucose monitoring.  Laboratory results. TREATMENT  First, knowing the cause of the hyperglycemia is important before the  hyperglycemia can be treated. Treatment may include, but is not be limited to:  Education.  Change or adjustment in medications.  Change or adjustment in meal plan.  Treatment for an illness, infection, etc.  More frequent blood glucose monitoring.  Change in exercise plan.  Decreasing or stopping steroids.  Lifestyle changes. HOME CARE INSTRUCTIONS   Test your blood glucose as directed.  Exercise regularly. Your caregiver will give you instructions about exercise. Pre-diabetes or diabetes which comes on with stress is helped by exercising.  Eat wholesome, balanced meals. Eat often and at regular, fixed times. Your caregiver or nutritionist will give you a meal plan to guide your sugar intake.  Being at an ideal weight is important. If needed, losing as little as 10 to 15 pounds may help improve blood glucose levels. SEEK MEDICAL CARE IF:   You have questions about medicine, activity, or diet.  You continue to have symptoms (problems such as increased thirst, urination, or weight gain). SEEK IMMEDIATE MEDICAL CARE IF:   You are vomiting or have diarrhea.  Your breath smells fruity.  You are breathing faster or slower.  You are very sleepy or incoherent.  You have numbness, tingling, or pain in your feet or hands.  You have chest pain.  Your symptoms get worse even though you have been following your caregiver's orders.  If you have any other questions or concerns.   This information is not intended to replace advice given to you by your health care provider. Make sure you discuss any questions you have with your health care provider.   Document Released: 04/07/2001 Document Revised: 01/04/2012 Document Reviewed: 06/18/2015 Elsevier Interactive Patient Education [employer].

## 2016-02-24 DEATH — deceased
